# Patient Record
Sex: Female | Born: 1970 | Race: White | Hispanic: No | Marital: Single | Smoking: Current every day smoker
Health system: Southern US, Community
[De-identification: ages and names within clinical notes are randomized; demographics above are authoritative.]

## PROBLEM LIST (undated history)

## (undated) DIAGNOSIS — F32A Depression, unspecified: Secondary | ICD-10-CM

## (undated) DIAGNOSIS — F988 Other specified behavioral and emotional disorders with onset usually occurring in childhood and adolescence: Secondary | ICD-10-CM

## (undated) DIAGNOSIS — F419 Anxiety disorder, unspecified: Secondary | ICD-10-CM

## (undated) HISTORY — PX: ABDOMINAL HYSTERECTOMY: SHX81

## (undated) HISTORY — PX: COLONOSCOPY W/ POLYPECTOMY: SHX1380

## (undated) HISTORY — PX: HEMORROIDECTOMY: SUR656

## (undated) HISTORY — DX: Other specified behavioral and emotional disorders with onset usually occurring in childhood and adolescence: F98.8

## (undated) HISTORY — PX: CYST EXCISION: SHX5701

---

## 2006-09-28 ENCOUNTER — Emergency Department: Payer: Self-pay | Admitting: Emergency Medicine

## 2007-09-20 ENCOUNTER — Ambulatory Visit: Payer: Self-pay | Admitting: Internal Medicine

## 2008-04-01 ENCOUNTER — Emergency Department: Payer: Self-pay | Admitting: Emergency Medicine

## 2008-10-09 ENCOUNTER — Emergency Department: Payer: Self-pay | Admitting: Emergency Medicine

## 2009-01-31 ENCOUNTER — Observation Stay: Payer: Self-pay | Admitting: Unknown Physician Specialty

## 2009-05-07 ENCOUNTER — Inpatient Hospital Stay: Payer: Self-pay

## 2010-06-05 ENCOUNTER — Ambulatory Visit: Payer: Self-pay | Admitting: Internal Medicine

## 2013-06-08 HISTORY — PX: BREAST BIOPSY: SHX20

## 2014-03-24 ENCOUNTER — Emergency Department: Payer: Self-pay | Admitting: Emergency Medicine

## 2014-03-24 LAB — CBC WITH DIFFERENTIAL/PLATELET
BASOS PCT: 0.7 %
Basophil #: 0.1 10*3/uL (ref 0.0–0.1)
EOS PCT: 1 %
Eosinophil #: 0.1 10*3/uL (ref 0.0–0.7)
HCT: 41.5 % (ref 35.0–47.0)
HGB: 13.7 g/dL (ref 12.0–16.0)
LYMPHS PCT: 29.7 %
Lymphocyte #: 3.3 10*3/uL (ref 1.0–3.6)
MCH: 32.5 pg (ref 26.0–34.0)
MCHC: 33 g/dL (ref 32.0–36.0)
MCV: 99 fL (ref 80–100)
MONOS PCT: 9.3 %
Monocyte #: 1 x10 3/mm — ABNORMAL HIGH (ref 0.2–0.9)
NEUTROS PCT: 59.3 %
Neutrophil #: 6.6 10*3/uL — ABNORMAL HIGH (ref 1.4–6.5)
Platelet: 307 10*3/uL (ref 150–440)
RBC: 4.21 10*6/uL (ref 3.80–5.20)
RDW: 12.5 % (ref 11.5–14.5)
WBC: 11.1 10*3/uL — ABNORMAL HIGH (ref 3.6–11.0)

## 2014-03-24 LAB — COMPREHENSIVE METABOLIC PANEL
ALBUMIN: 3.8 g/dL (ref 3.4–5.0)
Alkaline Phosphatase: 78 U/L
Anion Gap: 8 (ref 7–16)
BUN: 8 mg/dL (ref 7–18)
Bilirubin,Total: 0.4 mg/dL (ref 0.2–1.0)
CALCIUM: 9 mg/dL (ref 8.5–10.1)
CO2: 27 mmol/L (ref 21–32)
Chloride: 107 mmol/L (ref 98–107)
Creatinine: 0.83 mg/dL (ref 0.60–1.30)
Glucose: 87 mg/dL (ref 65–99)
Osmolality: 281 (ref 275–301)
POTASSIUM: 3.3 mmol/L — AB (ref 3.5–5.1)
SGOT(AST): 20 U/L (ref 15–37)
SGPT (ALT): 21 U/L
SODIUM: 142 mmol/L (ref 136–145)
Total Protein: 7.1 g/dL (ref 6.4–8.2)

## 2014-03-24 LAB — URINALYSIS, COMPLETE
Bacteria: NONE SEEN
Bilirubin,UR: NEGATIVE
Blood: NEGATIVE
GLUCOSE, UR: NEGATIVE mg/dL (ref 0–75)
KETONE: NEGATIVE
LEUKOCYTE ESTERASE: NEGATIVE
NITRITE: NEGATIVE
PROTEIN: NEGATIVE
Ph: 7 (ref 4.5–8.0)
RBC, UR: NONE SEEN /HPF (ref 0–5)
SPECIFIC GRAVITY: 1.004 (ref 1.003–1.030)
Squamous Epithelial: 3
WBC UR: <1 /HPF (ref 0–5)

## 2014-04-16 ENCOUNTER — Ambulatory Visit: Payer: Self-pay | Admitting: Obstetrics and Gynecology

## 2014-04-16 LAB — BASIC METABOLIC PANEL
Anion Gap: 6 — ABNORMAL LOW (ref 7–16)
BUN: 13 mg/dL (ref 7–18)
CHLORIDE: 105 mmol/L (ref 98–107)
CO2: 28 mmol/L (ref 21–32)
Calcium, Total: 8.1 mg/dL — ABNORMAL LOW (ref 8.5–10.1)
Creatinine: 0.88 mg/dL (ref 0.60–1.30)
Glucose: 104 mg/dL — ABNORMAL HIGH (ref 65–99)
Osmolality: 278 (ref 275–301)
Potassium: 3.5 mmol/L (ref 3.5–5.1)
SODIUM: 139 mmol/L (ref 136–145)

## 2014-04-16 LAB — CBC
HCT: 38.2 % (ref 35.0–47.0)
HGB: 13 g/dL (ref 12.0–16.0)
MCH: 33.5 pg (ref 26.0–34.0)
MCHC: 34 g/dL (ref 32.0–36.0)
MCV: 99 fL (ref 80–100)
Platelet: 171 10*3/uL (ref 150–440)
RBC: 3.87 10*6/uL (ref 3.80–5.20)
RDW: 12.3 % (ref 11.5–14.5)
WBC: 7.1 10*3/uL (ref 3.6–11.0)

## 2014-04-17 ENCOUNTER — Ambulatory Visit: Payer: Self-pay | Admitting: Obstetrics and Gynecology

## 2014-04-17 LAB — DRUG SCREEN, URINE
Amphetamines, Ur Screen: POSITIVE (ref ?–1000)
BARBITURATES, UR SCREEN: NEGATIVE (ref ?–200)
Benzodiazepine, Ur Scrn: NEGATIVE (ref ?–200)
Cannabinoid 50 Ng, Ur ~~LOC~~: NEGATIVE (ref ?–50)
Cocaine Metabolite,Ur ~~LOC~~: NEGATIVE (ref ?–300)
MDMA (Ecstasy)Ur Screen: POSITIVE (ref ?–500)
Methadone, Ur Screen: NEGATIVE (ref ?–300)
Opiate, Ur Screen: POSITIVE (ref ?–300)
PHENCYCLIDINE (PCP) UR S: NEGATIVE (ref ?–25)
TRICYCLIC, UR SCREEN: NEGATIVE (ref ?–1000)

## 2014-04-30 ENCOUNTER — Ambulatory Visit: Payer: Self-pay | Admitting: Obstetrics and Gynecology

## 2014-05-02 ENCOUNTER — Encounter: Payer: Self-pay | Admitting: *Deleted

## 2014-05-10 ENCOUNTER — Ambulatory Visit (INDEPENDENT_AMBULATORY_CARE_PROVIDER_SITE_OTHER): Payer: No Typology Code available for payment source | Admitting: General Surgery

## 2014-05-10 ENCOUNTER — Other Ambulatory Visit: Payer: No Typology Code available for payment source

## 2014-05-10 ENCOUNTER — Encounter: Payer: Self-pay | Admitting: General Surgery

## 2014-05-10 VITALS — BP 106/64 | HR 84 | Resp 16 | Ht 64.0 in | Wt 123.0 lb

## 2014-05-10 DIAGNOSIS — N63 Unspecified lump in breast: Secondary | ICD-10-CM

## 2014-05-10 DIAGNOSIS — N6310 Unspecified lump in the right breast, unspecified quadrant: Secondary | ICD-10-CM

## 2014-05-10 DIAGNOSIS — R928 Other abnormal and inconclusive findings on diagnostic imaging of breast: Secondary | ICD-10-CM

## 2014-05-10 NOTE — Patient Instructions (Addendum)
Continue self breast exams. Call office for any new breast issues or concerns.  After discussion with radiologist, it is determined that patient will need to return for an office ultrasound guided core biopsy. This is scheduled for tomorrow morning.

## 2014-05-10 NOTE — Progress Notes (Signed)
Patient ID: Lindsey Horn, female   DOB: 11/01/1970, 43 y.o.   MRN: [id number]  Chief Complaint  Patient presents with  . Breast Problem    HPI Lindsey Horn is a 43 y.o. female who presents for a breast evaluation. The most recent mammogram was done on 04/30/14 with right breast ultrasound. She states she can feel something in the right breast since they have pointed it out to her and that breast is a little heavy "tender" feeling.  She has lost weight (15 lb) over the past year.  Patient does not perform regular self breast checks and gets regular mammograms done.   Denies family history of breast or colon cancer. Denies any breast trauma.   Plans for a partial hysterectomy on May 25 2014.  Three children, 22, 2618 and a 915 yr old.  HPI  No past medical history on file.  Past Surgical History  Procedure Laterality Date  . Cyst excision  age 43    tongue  . Cesarean section      x3  . Colonoscopy w/ polypectomy    . Hemorroidectomy      No family history on file.  Social History History  Substance Use Topics  . Smoking status: Current Every Day Smoker -- 1.00 packs/day for 20 years    Types: Cigarettes  . Smokeless tobacco: Never Used  . Alcohol Use: 0.0 oz/week    0 Not specified per week    No Known Allergies  Current Outpatient Prescriptions  Medication Sig Dispense Refill  . buPROPion (WELLBUTRIN XL) 150 MG 24 hr tablet Take 150 mg by mouth daily.  11  . gabapentin (NEURONTIN) 600 MG tablet Take 600 mg by mouth 3 (three) times daily.     . meloxicam (MOBIC) 15 MG tablet Take 15 mg by mouth daily.   11  . traZODone (DESYREL) 100 MG tablet Take 100 mg by mouth as needed.     Marland Kitchen. VYVANSE 40 MG capsule Take 40 mg by mouth every morning.  0   No current facility-administered medications for this visit.    Review of Systems Review of Systems  Blood pressure 106/64, pulse 84, resp. rate 16, height 5\' 4"  (1.626 m), weight 123 lb (55.792 kg), last  menstrual period 04/23/2014.  Physical Exam Physical Exam  Constitutional: She is oriented to person, place, and time. She appears well-developed and well-nourished.  Neck: Neck supple.  Cardiovascular: Normal rate, regular rhythm and normal heart sounds.   Pulmonary/Chest: Effort normal and breath sounds normal. Right breast exhibits no inverted nipple, no mass, no nipple discharge, no skin change and no tenderness. Left breast exhibits no inverted nipple, no mass, no nipple discharge, no skin change and no tenderness.    Right breast > left breast about 1/2 cup. Small right chest bruise.  Lymphadenopathy:    She has no cervical adenopathy.    She has no axillary adenopathy.  Neurological: She is alert and oriented to person, place, and time.  Skin: Skin is warm and dry.    Data Reviewed Diagnostic mammograms dated 08/29/2012 for palpable lesions within the breast completed [facility] were reviewed. Mammograms and ultrasounds were negative. BI-RADS-1.  Screening mammograms were completed at [facility] in November 2015. The report from the providing facility was not available, but additional views were recommended of the right breast.  Right breast diagnostic mammogram and ultrasound dated 04/30/2014 [facility] reported a developing oval mass with indistinct margins in the lower inner right breast  posterior depth without associated calcifications. Ultrasound reported a 5 x 18 x 19 mm oval isoechoic mass with circumscribed margins at 5:00, 1 cm from the nipple. Ultrasound-guided biopsy was recommended. BI-RADS-4.  There appeared to be a significant discordance between the location of the mass on the mammogram, at the inframammary fold and the ultrasound reporting a mass in the near retroareolar area.   I recommended a repeat ultrasound be completed to reassess the area near the inframammary fold. In the right breast at the 5:00 position, 1 cm from the nipple a 0.7 x 1.6 x  1.8 cm smoothly marginated hypoechoic mass with sharp edge effect and faint posterior acoustic enhancement corresponding to [facility] study is identified. In spite of its superficial location this was not clinically palpable. No associated mammographic abnormalities appreciated in the retroareolar area.  At the 5:00 position 6 cm from the nipple there is an ill-defined heterogeneous area measuring 0.5 x 1.3 x 1.4 cm just above the level of the inframammary fold that seems to better correspond with the mammographic finding. BI-RADS-4.   [facility] films were reviewed with Annia Beltrew Davis, M.D. from the radiology department as Dr. Clelia CroftShaw would not be returning until next week. He felt that these were likely one in the same lesion as no other abnormality was noted on mammography and the possibility of an alternative projection based on her breast size could account for the discordance.    Assessment     Right breast lesion since March 2014.    Plan    The smoothly marginated lesion noted on ultrasound seems distinctly different than the irregular mass appreciated on mammography. I recommended the patient consider biopsy of both lesions followed by postbiopsy mammography. The risks associated with biopsy were reviewed including bleeding, pain and rarely infection. This will be scheduled at a convenient date.      Discuss mammograms with radiologist.  Discussed having a right breast biopsy prior to her planned surgery.  After discussion with radiologist, it is determined that patient will need to return for an office ultrasound guided core biopsy. This is scheduled for tomorrow morning.   PCP:  Titus MouldWhite, Elizabeth Burney Ref MD: Dr. Constance HolsterStaebler   Gavino Fouch, Merrily PewJeffrey W 05/10/2014, 8:58 PM

## 2014-05-11 ENCOUNTER — Ambulatory Visit: Payer: No Typology Code available for payment source | Admitting: General Surgery

## 2014-05-11 ENCOUNTER — Encounter: Payer: Self-pay | Admitting: General Surgery

## 2014-05-11 ENCOUNTER — Ambulatory Visit: Payer: Self-pay | Admitting: General Surgery

## 2014-05-11 ENCOUNTER — Ambulatory Visit: Payer: No Typology Code available for payment source

## 2014-05-11 VITALS — BP 122/70 | HR 74 | Resp 12 | Ht 64.0 in | Wt 123.0 lb

## 2014-05-11 DIAGNOSIS — N6310 Unspecified lump in the right breast, unspecified quadrant: Secondary | ICD-10-CM

## 2014-05-11 DIAGNOSIS — N63 Unspecified lump in breast: Secondary | ICD-10-CM

## 2014-05-11 DIAGNOSIS — R928 Other abnormal and inconclusive findings on diagnostic imaging of breast: Secondary | ICD-10-CM

## 2014-05-11 NOTE — Patient Instructions (Signed)

## 2014-05-11 NOTE — Progress Notes (Signed)
Patient ID: Lindsey Horn, female   DOB: [birth year], 43 y.o.   MRN: [id number]  Chief Complaint  Patient presents with  . Procedure    core biopsy    HPI Lindsey Horn is a 43 y.o. female here today for a right breast biopsy. The patient recent mammogram showed a new density at the periphery of the breast in the lower inner quadrant. Ultrasound completed at the hospital at suggested a retroareolar nodule likely a fibroadenoma. There appeared to be a discordance on my review the films and biopsy of both lesions was planned. HPI  No past medical history on file.  Past Surgical History  Procedure Laterality Date  . Cyst excision  age 43    tongue  . Cesarean section      x3  . Colonoscopy w/ polypectomy    . Hemorroidectomy      No family history on file.  Social History History  Substance Use Topics  . Smoking status: Current Every Day Smoker -- 1.00 packs/day for 20 years    Types: Cigarettes  . Smokeless tobacco: Never Used  . Alcohol Use: 0.0 oz/week    0 Not specified per week    No Known Allergies  Current Outpatient Prescriptions  Medication Sig Dispense Refill  . buPROPion (WELLBUTRIN XL) 150 MG 24 hr tablet Take 150 mg by mouth daily.  11  . gabapentin (NEURONTIN) 600 MG tablet Take 600 mg by mouth 3 (three) times daily.     . meloxicam (MOBIC) 15 MG tablet Take 15 mg by mouth daily.   11  . traZODone (DESYREL) 100 MG tablet Take 100 mg by mouth as needed.     Marland Kitchen. VYVANSE 40 MG capsule Take 40 mg by mouth every morning.  0   No current facility-administered medications for this visit.    Review of Systems Review of Systems  Constitutional: Negative.   Respiratory: Negative.   Cardiovascular: Negative.     Blood pressure 122/70, pulse 74, resp. rate 12, height 5\' 4"  (1.626 m), weight 123 lb (55.792 kg), last menstrual period 04/23/2014.  Physical Exam Physical Exam No palpable abnormalities noted within the breast. Data Reviewed A total of 16 mL of  0.5% Xylocaine with 0.25% Marcaine with 1-200,000 epinephrine was utilized for local anesthesia both lesions. ChloraPrep was applied to the skin. The lesion in the retroareolar tissue at the 5:00 position, 1 centimeter from the nipple was biopsied making use of a 14-gauge Finesse device. Approximately 8 core samples were obtained and a postbiopsy clip was placed. Minimal bleeding noted.  Attention was turned to the area the 4:00 position at the inframammary fold. Approximately 8 core samples were obtained and a postbiopsy clip placed. There is mild discomfort during the last 2 samples. This resolved promptly by the end of the procedure.  Both wounds were closed making use of benzoin and Steri-Strips followed by Telfa and Tegaderm dressings.  [facility]. Clips are noted in the retroareolar area and just lateral to the density in the lower inner quadrant.  Assessment    Abnormal mammogram.    Plan    Postbiopsy wound care was reviewed.  The patient will return in one week for wound evaluation with the staff.  She'll be contacted when pathology is available.       Earline MayotteByrnett, Mauro Arps W 05/11/2014, 7:44 PM

## 2014-05-14 ENCOUNTER — Telehealth: Payer: Self-pay | Admitting: *Deleted

## 2014-05-14 ENCOUNTER — Telehealth: Payer: Self-pay | Admitting: General Surgery

## 2014-05-14 NOTE — Telephone Encounter (Signed)
Patient notified that both biopsies done on 05-11-14 came back benign. This patient verbalizes understanding.

## 2014-05-15 LAB — PATHOLOGY

## 2014-05-15 NOTE — Telephone Encounter (Signed)
x

## 2014-05-17 ENCOUNTER — Ambulatory Visit: Payer: Self-pay | Admitting: Obstetrics and Gynecology

## 2014-05-17 LAB — BASIC METABOLIC PANEL
Anion Gap: 7 (ref 7–16)
BUN: 14 mg/dL (ref 7–18)
CO2: 25 mmol/L (ref 21–32)
Calcium, Total: 8.8 mg/dL (ref 8.5–10.1)
Chloride: 108 mmol/L — ABNORMAL HIGH (ref 98–107)
Creatinine: 0.68 mg/dL (ref 0.60–1.30)
EGFR (African American): >60
EGFR (Non-African Amer.): >60
GLUCOSE: 126 mg/dL — AB (ref 65–99)
Osmolality: 281 (ref 275–301)
POTASSIUM: 3.3 mmol/L — AB (ref 3.5–5.1)
Sodium: 140 mmol/L (ref 136–145)

## 2014-05-17 LAB — CBC
HCT: 45 % (ref 35.0–47.0)
HGB: 15.2 g/dL (ref 12.0–16.0)
MCH: 32.8 pg (ref 26.0–34.0)
MCHC: 33.7 g/dL (ref 32.0–36.0)
MCV: 97 fL (ref 80–100)
Platelet: 226 10*3/uL (ref 150–440)
RBC: 4.63 10*6/uL (ref 3.80–5.20)
RDW: 12.5 % (ref 11.5–14.5)
WBC: 7.3 10*3/uL (ref 3.6–11.0)

## 2014-05-18 ENCOUNTER — Ambulatory Visit: Payer: No Typology Code available for payment source

## 2014-05-22 ENCOUNTER — Ambulatory Visit: Payer: No Typology Code available for payment source

## 2014-05-25 ENCOUNTER — Ambulatory Visit: Payer: Self-pay | Admitting: Obstetrics and Gynecology

## 2014-05-25 LAB — POTASSIUM: POTASSIUM: 3.9 mmol/L (ref 3.5–5.1)

## 2014-05-25 LAB — DRUG SCREEN, URINE
Amphetamines, Ur Screen: POSITIVE (ref ?–1000)
Barbiturates, Ur Screen: NEGATIVE (ref ?–200)
Benzodiazepine, Ur Scrn: NEGATIVE (ref ?–200)
CANNABINOID 50 NG, UR ~~LOC~~: NEGATIVE (ref ?–50)
COCAINE METABOLITE, UR ~~LOC~~: NEGATIVE (ref ?–300)
MDMA (Ecstasy)Ur Screen: POSITIVE (ref ?–500)
METHADONE, UR SCREEN: NEGATIVE (ref ?–300)
Opiate, Ur Screen: NEGATIVE (ref ?–300)
Phencyclidine (PCP) Ur S: NEGATIVE (ref ?–25)
TRICYCLIC, UR SCREEN: NEGATIVE (ref ?–1000)

## 2014-05-26 LAB — CBC WITH DIFFERENTIAL/PLATELET
Basophil #: 0.1 10*3/uL (ref 0.0–0.1)
Basophil %: 0.4 %
Eosinophil #: 0 10*3/uL (ref 0.0–0.7)
Eosinophil %: 0 %
HCT: 38.3 % (ref 35.0–47.0)
HGB: 12.5 g/dL (ref 12.0–16.0)
LYMPHS ABS: 2.2 10*3/uL (ref 1.0–3.6)
LYMPHS PCT: 12.4 %
MCH: 32.2 pg (ref 26.0–34.0)
MCHC: 32.8 g/dL (ref 32.0–36.0)
MCV: 98 fL (ref 80–100)
MONO ABS: 0.8 x10 3/mm (ref 0.2–0.9)
Monocyte %: 4.4 %
NEUTROS ABS: 14.9 10*3/uL — AB (ref 1.4–6.5)
NEUTROS PCT: 82.8 %
Platelet: 250 10*3/uL (ref 150–440)
RBC: 3.89 10*6/uL (ref 3.80–5.20)
RDW: 12.7 % (ref 11.5–14.5)
WBC: 18 10*3/uL — AB (ref 3.6–11.0)

## 2014-05-26 LAB — BASIC METABOLIC PANEL
Anion Gap: 7 (ref 7–16)
BUN: 8 mg/dL (ref 7–18)
CHLORIDE: 106 mmol/L (ref 98–107)
CREATININE: 0.9 mg/dL (ref 0.60–1.30)
Calcium, Total: 8.5 mg/dL (ref 8.5–10.1)
Co2: 26 mmol/L (ref 21–32)
Glucose: 137 mg/dL — ABNORMAL HIGH (ref 65–99)
OSMOLALITY: 278 (ref 275–301)
Potassium: 3.8 mmol/L (ref 3.5–5.1)
Sodium: 139 mmol/L (ref 136–145)

## 2014-07-21 ENCOUNTER — Emergency Department: Payer: Self-pay | Admitting: Emergency Medicine

## 2014-08-02 ENCOUNTER — Emergency Department: Payer: Self-pay | Admitting: Emergency Medicine

## 2014-09-21 ENCOUNTER — Other Ambulatory Visit: Payer: Self-pay | Admitting: General Surgery

## 2014-09-21 DIAGNOSIS — N63 Unspecified lump in unspecified breast: Secondary | ICD-10-CM

## 2014-09-29 NOTE — Op Note (Signed)
PATIENT NAME:  Lindsey Horn, Lindsey Horn MR#:  [id number] DATE OF BIRTH:  Aug 06, 1970  DATE OF PROCEDURE:  04/17/2014  PREOPERATIVE DIAGNOSES:  1.  Chronic pelvic pain. 2.  Menorrhagia. 3.  Severe dysmenorrhea.   POSTOPERATIVE DIAGNOSES:  1.  Chronic pelvic pain. 2.  Menorrhagia. 3.  Severe dysmenorrhea.  4.  Significant intra-abdominal adhesions from prior C-section.   OPERATION PERFORMED: Hysteroscopy, dilatation and curettage, cystoscopy and diagnostic laparoscopy.   ANESTHESIA USED: General.   PRIMARY SURGEON: Lorrene ReidAndreas M Argyle Gustafson, MD    ESTIMATED BLOOD LOSS: Minimal.   OPERATIVE FLUIDS: 50 mL of urine output.   COMPLICATIONS: None.   INTRAOPERATIVE FINDINGS: Significant scarring at the prior bladder flap and anterior uterine wall with partial fusion to the anterior abdominal wall.  Some adhesions of omentum which were taken down with the 5 mm Harmonic cystoscopy and hysteroscopy revealed normal findings.   SPECIMENS REMOVED: Endometrial curettings.   CONDITION FOLLOWING PROCEDURE: Stable.   PROCEDURE IN DETAIL: Risks, benefits, and alternatives of the procedure were discussed with the patient prior to proceeding to the Operating Room. The patient was taken to the Operating Room where she was placed under general endotracheal anesthesia. She was positioned in the dorsal lithotomy position, prepped and draped in the usual sterile fashion. Timeout was performed. Attention was turned to the patient's pelvis. The bladder was straight catheterized with a red rubber catheter. An operative speculum was placed. The anterior lip of the cervix was visualized, grasped with a single-tooth tenaculum and sequentially dilated using Pratt dilators.  A Hulka tenaculum was then placed to allow manipulation of the uterus, the single-tooth tenaculum and speculum were then removed.  Attention was turned to the patient's abdomen. The umbilicus was infiltrated with 0.5% Sensorcaine plain. A stab incision was made  at the base of the umbilicus and a 5 mm XL trocar was used to gain direct entry into the peritoneal cavity under direct visualization.  Pneumoperitoneum was established. A 5 mm left lower quadrant assistant port was then placed under direct visualization. Inspection of the abdomen revealed the normal liver, normal appendix, normal caliber of both the ureters. The posterior cul-de-sac was free of endometriotic implants or scarring.  The ovaries appeared normal. There was a simple cyst on the right ovary which was ruptured.  Inspection of the anterior cul-de-sac revealed dense adhesive tissue involving the bladder and anterior uterus to the anterior abdominal wall at the site of the patient's prior hysterotomy scar.  There were 2 small filmy adhesions of omentum which were taken down with a 5 mm Harmonic; 1 on the left and 1 on the right mid abdomen.  Following this, the pneumoperitoneum was evacuated.  Attention was turned to the patient's pelvis again and cystoscopy was performed noting normal bladder dome, bilateral efflux of urine from both UO's, and otherwise, a normal appearing bladder epithelium.  Following the cystoscopy portion of the case, the operative speculum was replaced, the cervix was regrasped with a single-tooth tenaculum after releasing the Hulka tenaculum.  The cervix was further dilated to allow passage of the hysteroscope.  Hysteroscopy revealed normal fallopian tube ostia bilaterally. The cavity was grossly normal in shape with no defects visualized.  A sharp curettage was performed yielding a moderate amount of tissue. The single-tooth tenaculum was removed. The cervix was inspected and noted to be hemostatic. Pneumoperitoneum was re-established and the abdomen was reinspected, irrigated before removing pneumoperitoneum again and removing the trocars.  Each trocar site was dressed with Dermabond.  Sponge, needle, and  instruments were correct x 2.  The patient tolerated the procedure well and  was taken to the recovery room in stable condition. \    ____________________________ Florina Ou. Bonney Aid, MD ams:DT D: 04/17/2014 09:34:56 ET T: 04/17/2014 09:47:45 ET JOB#: [id number]  cc: Florina Ou. Bonney Aid, MD, <Dictator> Carmel [place] Cathrine Muster MD ELECTRONICALLY SIGNED 04/26/2014 8:51

## 2014-10-01 LAB — SURGICAL PATHOLOGY

## 2014-10-03 NOTE — Op Note (Signed)
PATIENT NAME:  Lindsey Horn, STREB MR#:  [id number] DATE OF BIRTH:  08-17-1970  DATE OF PROCEDURE:  05/25/2014  PREOPERATIVE DIAGNOSES: Chronic pelvic pain and uterine adhesions identified on prior diagnostic laparoscopy.   POSTOPERATIVE DIAGNOSES: Chronic pelvic pain and uterine adhesions identified on prior diagnostic laparoscopy.    OPERATION PERFORMED: Robotic total laparoscopic hysterectomy, bilateral salpingectomy, extensive lysis of adhesions and cystoscopy.   ANESTHESIA USED: General.   PRIMARY SURGEON: Florina Ou. Bonney Aid, MD    ASSISTANT: Chelsea C. Ward, MD   PREOPERATIVE ANTIBIOTICS: 2 grams of Ancef.   DRAINS OR TUBES: Foley to gravity drainage.   IMPLANTS: None.   ESTIMATED BLOOD LOSS: 100 mL.   OPERATIVE FLUIDS: 1500 mL of crystalloid.   COMPLICATIONS: None.   INTRAOPERATIVE FINDINGS: Dense adhesive disease of the anterior left uterine fundus to the anterior abdominal wall including bladder at the site of a prior hysterotomy, intact bladder dome with bilateral efflux of urine from both ureteral orifices.   SPECIMENS REMOVED: Uterus, cervix, right and left tube.   PATIENT CONDITION FOLLOWING PROCEDURE: Stable.   PROCEDURE IN DETAIL: Risks, benefits, and alternatives of the procedure were discussed with the patient prior to proceeding to the Operating Room. In addition, prior to proceeding to the Operating Room, we had discussed the possibility of the patient having continued pain following the procedure although given the findings on diagnostic laparoscopy matched in laterality to the findings, it was reasonable to assume that her pain was coming from the scar tissue from her prior C-sections. The patient was taken to the Operating Room where she was placed under general endotracheal anesthesia. She was positioned in the dorsal lithotomy position and prepped and draped in the usual sterile fashion. A timeout was performed. The patient was tested in steep Trendelenburg with  proper positioning confirmed. The patient was then prepped and draped in the usual sterile fashion. A timeout was performed.   Attention was turned to the patient's pelvis. The bladder was drained using an in-dwelling Foley catheter. The operative speculum was used. The anterior lip of the cervix was grasped with a single-tooth tenaculum and a medium VCare device was placed to allow manipulation of the uterus. The single-tooth tenaculum and operative speculum were removed. Attention was turned to the patient's abdomen. The umbilicus was infiltrated with 0.5% Sensorcaine. A stab incision was made at the base of the umbilicus and a Veress needle was used to obtain pneumoperitoneum. Two clicks were noted with the Veress needle. Water drop test confirmed intraperitoneal entry and opening pressure was 2 mmHg.    Once pneumoperitoneum had been established, an 8 mm robotic camera trocar was placed through the umbilical incision. The remaining ports were placed under direct visualization. These included 2 lateral 8 mm robotic ports, 1 in the left lower quadrant and 1 in the right lower quadrant and a right upper quadrant 11 mm assistant port. The robot was then brought in and docked. The case was begun.   Attention was turned to the patient's left adnexa. The fallopian tube was dissected off its attachments to the ovary and mesosalpinx using the fenestrated bipolar and the monopolar scissors. The utero-ovarian and round ligaments were then cauterized and transected. The anterior leaf of the broad was dissected down to the adhesions on the left portion of the anterior uterus. The adhesions themselves were then started to be taken down using the monopolar scissors. The thicker band of adhesion was left intact.   Attention was then turned to the patient's right. The  right adnexal structures were identified. The fallopian tube was transected off the uterus. The utero-ovarian ligament and round ligaments were then  cauterized and transected. The anterior leaf of the broad was taken down to the level of the internal cervical os. The posterior leaf was skeletonized and the uterine artery was visualized, cauterized, and transected using the Harmonic and the monopolar scissors. The bladder was then carefully dissected off the lower uterine segment. The bladder was at one point retrograde filled with methylene blue to allow visualization of the bladder and verify that it was being taken off in the correct plane. Following this, a colpotomy was made circumferentially around the specimen starting anteriorly and moving clockwise. By doing this, the left adhesion was able to be further visualized. The left uterine artery was skeletonized, cauterized and transected. The adhesion was then taken down by staying medial to the transection point of the uterine arteries to take the adhesion down. The specimen was then removed vaginally. The vaginal cuff was closed using a V-Loc suture device. Following the closure of the vaginal cuff, the pelvis was irrigated and noted to be hemostatic. One gram of Arista powder was applied to all pedicles. Pneumoperitoneum was evacuated. A cystoscopy from below revealed intact bladder dome and bilateral efflux of urine from both UO's. The ureters had been visualized from the abdominal side as well prior to removing the port sites.   The port sites were closed with a 4-0 Monocryl and Dermabond. The 11 mm port site was also closed with a fascial stitch of 0 Vicryl. Sponge, needle and instrument counts were correct x2. The patient tolerated the procedure well and was taken to the recovery room in stable condition.   ____________________________ Florina OuAndreas M. Bonney AidStaebler, MD ams:AT D: 05/25/2014 16:51:03 ET T: 05/25/2014 23:12:37 ET JOB#: [id number]  cc: Florina OuAndreas M. Bonney AidStaebler, MD, <Dictator> Carmel SacramentoANDREAS Cathrine MusterM Haydon Kalmar MD ELECTRONICALLY SIGNED 06/19/2014 15:36

## 2014-10-22 ENCOUNTER — Other Ambulatory Visit: Payer: Self-pay

## 2014-10-23 ENCOUNTER — Telehealth: Payer: Self-pay | Admitting: *Deleted

## 2014-10-23 NOTE — Telephone Encounter (Signed)
Left message for patient to call the office back to r/s her appointment

## 2014-10-29 ENCOUNTER — Ambulatory Visit: Payer: No Typology Code available for payment source | Admitting: General Surgery

## 2014-11-02 ENCOUNTER — Ambulatory Visit
Admission: RE | Admit: 2014-11-02 | Discharge: 2014-11-02 | Disposition: A | Discharge status: Home / Self Care | Payer: No Typology Code available for payment source | Source: Ambulatory Visit | Attending: General Surgery | Admitting: General Surgery

## 2014-11-02 DIAGNOSIS — N63 Unspecified lump in unspecified breast: Secondary | ICD-10-CM

## 2014-11-02 DIAGNOSIS — N6489 Other specified disorders of breast: Secondary | ICD-10-CM | POA: Insufficient documentation

## 2014-11-08 ENCOUNTER — Encounter: Payer: Self-pay | Admitting: General Surgery

## 2014-11-08 ENCOUNTER — Ambulatory Visit (INDEPENDENT_AMBULATORY_CARE_PROVIDER_SITE_OTHER): Payer: No Typology Code available for payment source | Admitting: General Surgery

## 2014-11-08 VITALS — BP 122/82 | HR 76 | Resp 12 | Ht 64.0 in | Wt 114.0 lb

## 2014-11-08 DIAGNOSIS — N63 Unspecified lump in breast: Secondary | ICD-10-CM | POA: Diagnosis not present

## 2014-11-08 DIAGNOSIS — N6310 Unspecified lump in the right breast, unspecified quadrant: Secondary | ICD-10-CM

## 2014-11-08 DIAGNOSIS — R928 Other abnormal and inconclusive findings on diagnostic imaging of breast: Secondary | ICD-10-CM

## 2014-11-08 NOTE — Patient Instructions (Addendum)
Continue with self breast exams, call with any changes. Return in 6 months with bilateral diagnostic mammogram.

## 2014-11-08 NOTE — Progress Notes (Signed)
Patient ID: Lindsey Horn, female   DOB: September 06, 1970, 44 y.o.   MRN: [id number]  Chief Complaint  Patient presents with  . Follow-up    mammogram    HPI Lindsey Horn is a 44 y.o. female who presents for a breast evaluation. The most recent mammogram was done on 11/02/14 Patient does perform regular self breast checks and gets regular mammograms done.  Patient states right breast tender for about 6 months or more.    HPI  No past medical history on file.  Past Surgical History  Procedure Laterality Date  . Cyst excision  age 44    tongue  . Cesarean section      x3  . Colonoscopy w/ polypectomy    . Hemorroidectomy    . Breast biopsy Right 2015    neg    Family History  Problem Relation Age of Onset  . Breast cancer Maternal Grandmother     Social History History  Substance Use Topics  . Smoking status: Current Every Day Smoker -- 1.00 packs/day for 20 years    Types: Cigarettes  . Smokeless tobacco: Never Used  . Alcohol Use: 0.0 oz/week    0 Standard drinks or equivalent per week    No Known Allergies  Current Outpatient Prescriptions  Medication Sig Dispense Refill  . buPROPion (WELLBUTRIN XL) 150 MG 24 hr tablet Take 150 mg by mouth daily.  11  . gabapentin (NEURONTIN) 600 MG tablet Take 600 mg by mouth 3 (three) times daily.     . meloxicam (MOBIC) 15 MG tablet Take 15 mg by mouth daily.   11  . traZODone (DESYREL) 100 MG tablet Take 100 mg by mouth as needed.     Marland Kitchen. VYVANSE 40 MG capsule Take 40 mg by mouth every morning.  0   No current facility-administered medications for this visit.    Review of Systems Review of Systems  Constitutional: Negative.   Respiratory: Negative.   Cardiovascular: Negative.     Blood pressure 122/82, pulse 76, resp. rate 12, height 5\' 4"  (1.626 m), weight 114 lb (51.71 kg).  Physical Exam Physical Exam  Constitutional: She is oriented to person, place, and time. She appears well-developed and well-nourished.   Cardiovascular: Normal rate and normal heart sounds.   Pulmonary/Chest: Effort normal and breath sounds normal. Right breast exhibits tenderness. Right breast exhibits no inverted nipple, no mass, no nipple discharge and no skin change. Left breast exhibits no inverted nipple, no mass, no nipple discharge, no skin change and no tenderness. Breasts are symmetrical.    Mild diffuse tenderness throughout the right breast, more notable in the lower inner quadrant. Granular texture to the breast bilaterally.  Lymphadenopathy:  6 mm smooth, nontender node at the apex of the right axilla. Left axilla negative.  Neurological: She is alert and oriented to person, place, and time.  Skin: Skin is warm and dry.    Data Reviewed Recent mammogram and ultrasound reviewed and compared to the December 2015 ultrasound of the right lower inner quadrant area biopsied in December. There appears to be good correlation of the area of biopsy and the present area of radiographic concern. Biopsy results below.    PATH REPORT.FINAL DX SPEC Comment   Comments:   Diagnosis:  BREAST, RIGHT:  - BENIGN BREAST EPITHELIUM AND ADJACENT FIBROADIPOSE TISSUE.  - NEGATIVE FOR ATYPIA AND MALIGNANCY.  NOTE: The results of this case were discussed with Dr. Lemar LivingsByrnett on  May 14, 2014 by Dr. Excell SeltzerBaker.  XDB/05/14/2014          The radiologist commented that the biopsy clip was posterior to the area of concern. This may be clip migration into the adipose tissue, as images obtained during the procedure confirm the area of concern was biopsied.  Assessment    Stable mammogram/ultrasound. Previous biopsy of area benign.    Plan    The radiologist recommendations were reviewed with the patient. At this time, I don't see indications to rebiopsy the area. The opportunity for formal excision to put all issues to rest was discussed but deferred at this time. I think it reasonable to go ahead and plan on a repeat bilateral  diagnostic mammograms in November 2016 with an office visit to follow.      Patient to have a bilateral diagnostic mammogram follow up in 6 months.  PCP: Trudie Buckler 11/08/2014, 2:05 PM

## 2015-02-28 ENCOUNTER — Other Ambulatory Visit: Payer: Self-pay

## 2015-02-28 DIAGNOSIS — N6310 Unspecified lump in the right breast, unspecified quadrant: Secondary | ICD-10-CM

## 2015-03-11 IMAGING — US US BREAST*R* LIMITED INC AXILLA
1 series · 6 of 6 positions shown · non-contrast
Comparison: 04/03/2014, 08/29/2012

CLINICAL DATA: 43-year-old female, callback from screening
mammogram for possible right breast mass

EXAM:
DIGITAL DIAGNOSTIC  RIGHT MAMMOGRAM
ULTRASOUND RIGHT BREAST

[Series 1: us breast*right* limited inc axilla · 0.08mm/px · 6 of 6 slices shown]
[im 1/6]
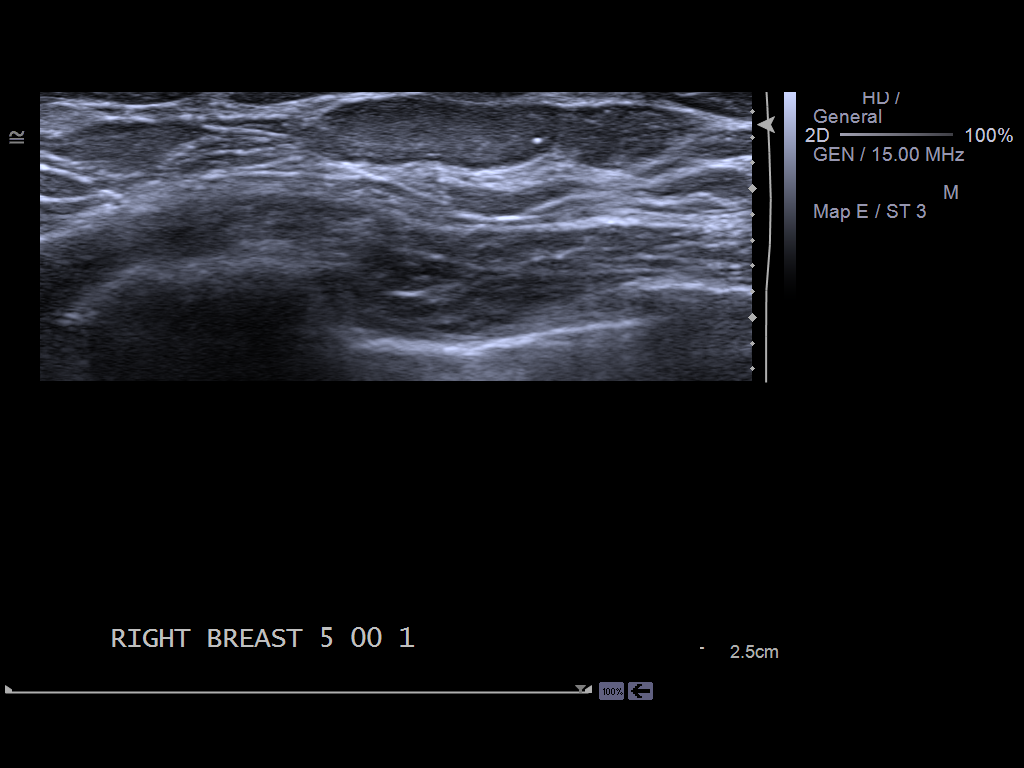
[im 2/6]
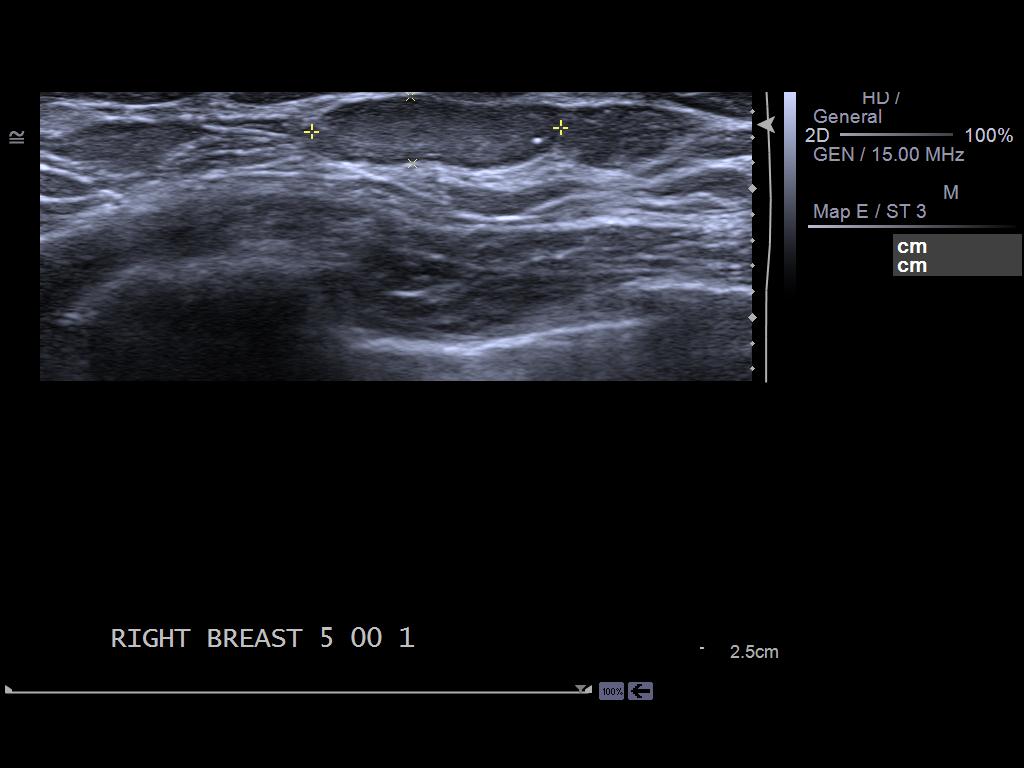
[im 3/6]
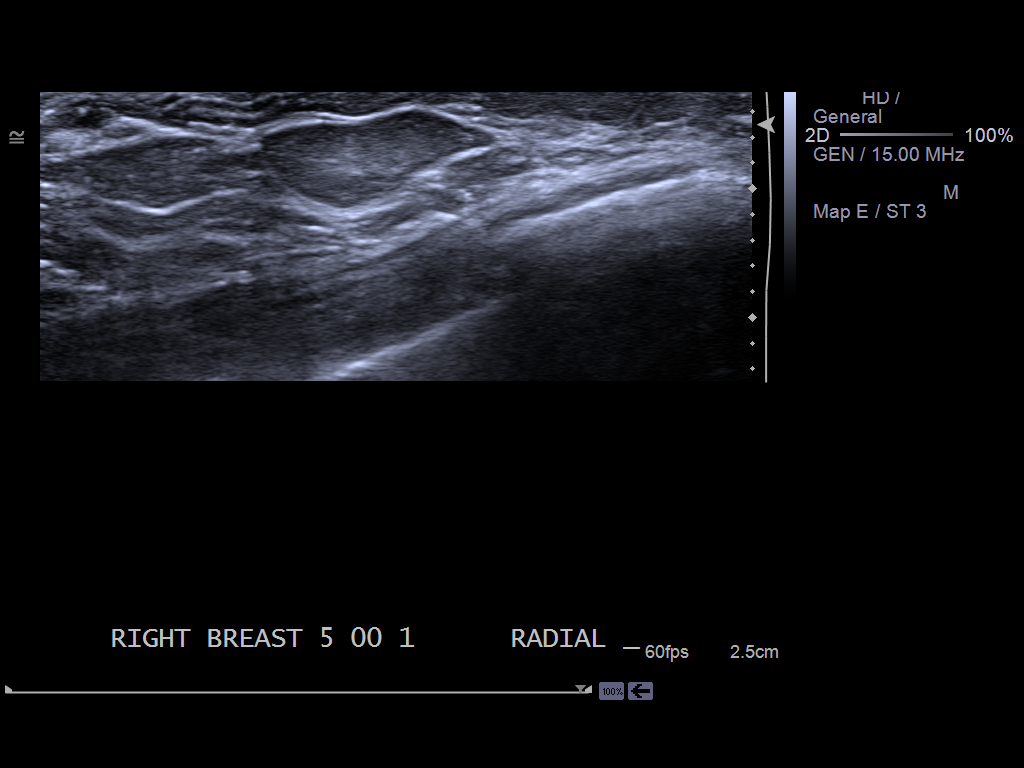
[im 4/6]
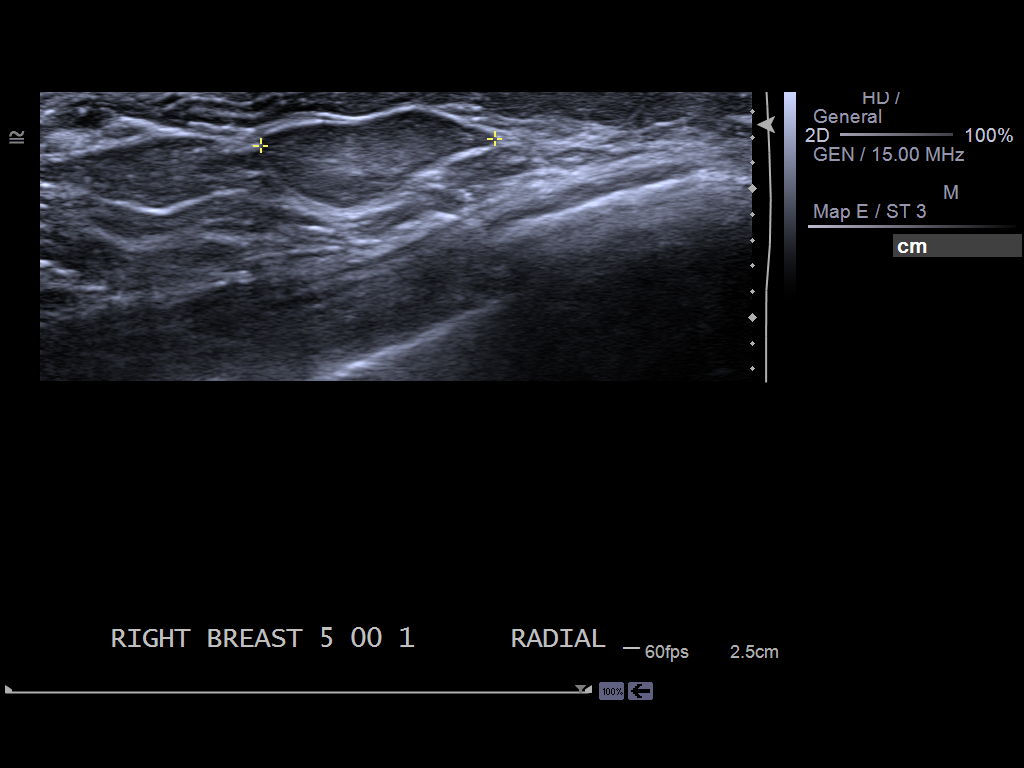
[im 5/6]
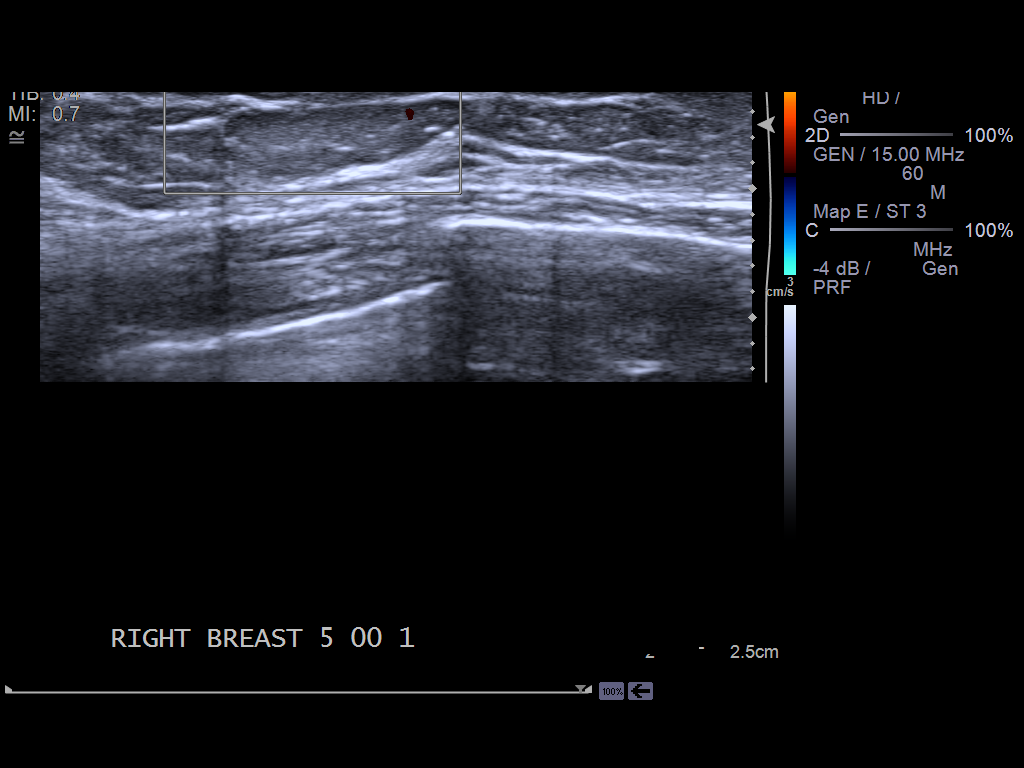
[im 6/6]
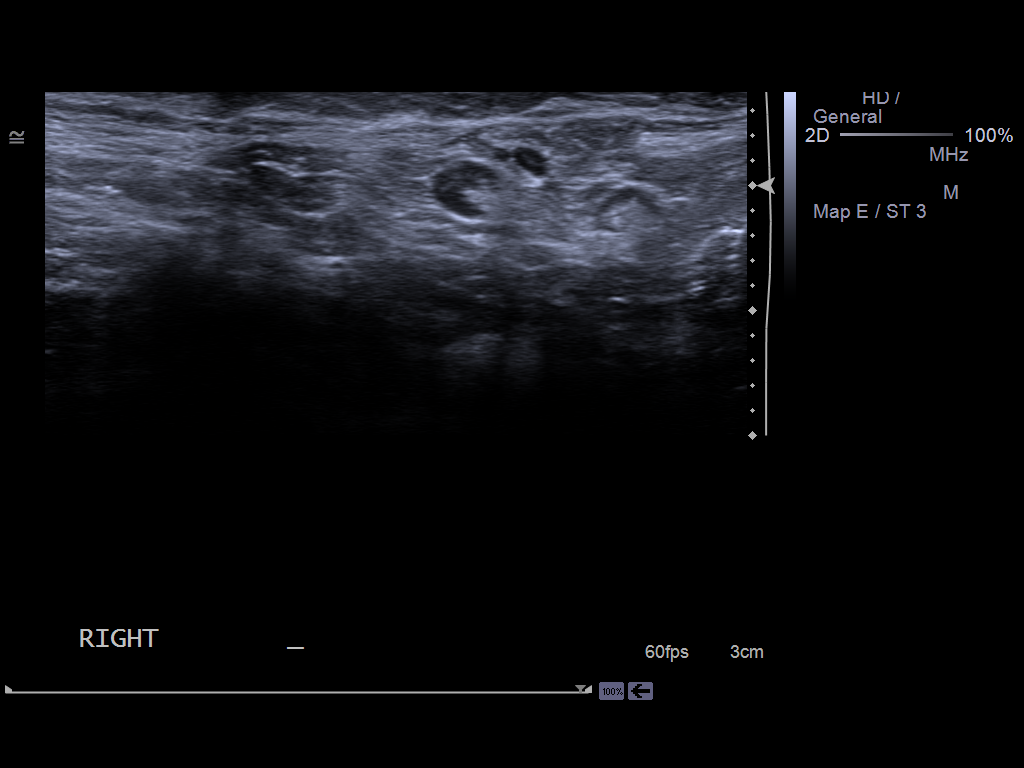

[6 of 6 positions shown; findings below may reference images not displayed]

ACR Breast Density Category c: The breast tissue is heterogeneously
dense, which may obscure small masses.
FINDINGS: On additional views, there is a developing oval mass with indistinct
margins within the lower, inner right breast, posterior depth. No
associated calcifications.

Targeted ultrasound demonstrates an 18 x 19 x 5 mm oval, isoechoic
mass with circumscribed margins at 5 o'clock, 1 cm from the nipple.
No axillary adenopathy.
IMPRESSION: Developing mass within the lower, inner right breast. While this
mass likely represents an enlarging fibroadenoma, ultrasound-guided
biopsy is recommended due to its increasing size.

RECOMMENDATION:
Ultrasound-guided right breast biopsy.

I have discussed the findings and recommendations with the patient.
Results were also provided in writing at the conclusion of the
visit. If applicable, a reminder letter will be sent to the patient
regarding the next appointment.

BI-RADS CATEGORY  4: Suspicious.

## 2015-04-10 ENCOUNTER — Other Ambulatory Visit: Payer: Self-pay | Admitting: General Surgery

## 2015-04-10 ENCOUNTER — Encounter (INDEPENDENT_AMBULATORY_CARE_PROVIDER_SITE_OTHER): Payer: Self-pay

## 2015-04-10 ENCOUNTER — Ambulatory Visit
Admission: RE | Admit: 2015-04-10 | Discharge: 2015-04-10 | Disposition: A | Discharge status: Home / Self Care | Payer: No Typology Code available for payment source | Source: Ambulatory Visit | Attending: General Surgery | Admitting: General Surgery

## 2015-04-10 DIAGNOSIS — N63 Unspecified lump in breast: Secondary | ICD-10-CM | POA: Diagnosis present

## 2015-04-10 DIAGNOSIS — N6310 Unspecified lump in the right breast, unspecified quadrant: Secondary | ICD-10-CM

## 2015-04-10 DIAGNOSIS — R928 Other abnormal and inconclusive findings on diagnostic imaging of breast: Secondary | ICD-10-CM | POA: Insufficient documentation

## 2015-04-16 ENCOUNTER — Ambulatory Visit (INDEPENDENT_AMBULATORY_CARE_PROVIDER_SITE_OTHER): Payer: No Typology Code available for payment source | Admitting: General Surgery

## 2015-04-16 ENCOUNTER — Encounter: Payer: Self-pay | Admitting: General Surgery

## 2015-04-16 VITALS — BP 122/82 | HR 76 | Resp 12 | Ht 64.0 in | Wt 125.0 lb

## 2015-04-16 DIAGNOSIS — N644 Mastodynia: Secondary | ICD-10-CM

## 2015-04-16 DIAGNOSIS — R928 Other abnormal and inconclusive findings on diagnostic imaging of breast: Secondary | ICD-10-CM | POA: Diagnosis not present

## 2015-04-16 NOTE — Progress Notes (Signed)
Patient ID: Lindsey Horn, female   DOB: July 23, 1970, 44 y.o.   MRN: [id number]  Chief Complaint  Patient presents with  . Follow-up    mammogram    HPI Lindsey Horn is a 44 y.o. female who presents for a breast evaluation. The most recent mammogram was done on 04/10/15 .  Patient does perform regular self breast checks and gets regular mammograms done.  No new breast issues.   The patient reports daily bilateral breast discomfort, worse during the time when she would normally be experiencing her menses. She consumes consistently high quantities of caffeine-containing beverages. She was accompanied today by her 65-year-old daughter.  I personally reviewed the history at the patient. HPI  No past medical history on file.  Past Surgical History  Procedure Laterality Date  . Cyst excision  age 50    tongue  . Cesarean section      x3  . Colonoscopy w/ polypectomy    . Hemorroidectomy    . Breast biopsy Right 2015    neg    Family History  Problem Relation Age of Onset  . Breast cancer Maternal Grandmother     Social History Social History  Substance Use Topics  . Smoking status: Current Every Day Smoker -- 1.00 packs/day for 20 years    Types: Cigarettes  . Smokeless tobacco: Never Used  . Alcohol Use: 0.0 oz/week    0 Standard drinks or equivalent per week    No Known Allergies  Current Outpatient Prescriptions  Medication Sig Dispense Refill  . gabapentin (NEURONTIN) 600 MG tablet Take 600 mg by mouth 3 (three) times daily.     . meloxicam (MOBIC) 15 MG tablet Take 15 mg by mouth daily.   11  . traZODone (DESYREL) 100 MG tablet Take 100 mg by mouth as needed.     . valACYclovir (VALTREX) 500 MG tablet Take by mouth.    Marland Kitchen VYVANSE 40 MG capsule Take 40 mg by mouth every morning.  0   No current facility-administered medications for this visit.    Review of Systems Review of Systems  Constitutional: Negative.   Respiratory: Negative.     Blood pressure  122/82, pulse 76, resp. rate 12, height  (1.626 m), weight 125 lb (56.7 kg).  Physical Exam Physical Exam  Constitutional: She is oriented to person, place, and time. She appears well-developed and well-nourished.  Eyes: Conjunctivae are normal. No scleral icterus.  Neck: Neck supple.  Cardiovascular: Normal rate, regular rhythm and normal heart sounds.   Pulmonary/Chest: Effort normal and breath sounds normal. Right breast exhibits tenderness. Right breast exhibits no inverted nipple, no mass, no nipple discharge and no skin change. Left breast exhibits tenderness. Left breast exhibits no inverted nipple, no mass, no nipple discharge and no skin change.  Lymphadenopathy:    She has no cervical adenopathy.    She has no axillary adenopathy.  Neurological: She is alert and oriented to person, place, and time.  Skin: Skin is warm and dry.    Data Reviewed Mammograms dated 04/10/2015 were reviewed. BI-RADS-3. Short-term follow-up recommended for the right breast. Asymmetric breast parenchyma noted.  Assessment    9 breast exam, previous benign biopsies.  Mastalgia likely secondary to caffeine stimulation.    Plan    Ryland antioxidants with  Ocuvite twice a day recommended. Efforts to minimize caffeine consumption encouraged.    The patient has been asked to return to the office in six months with a right  diagnostic mammogram.  PCP: Trudie BucklerWhite, Elizabeth    Joy Haegele W 04/17/2015, 6:09 AM

## 2015-04-16 NOTE — Patient Instructions (Signed)
The patient has been asked to return to the office in six months with a right diagnostic mammogram. 

## 2015-04-17 DIAGNOSIS — N644 Mastodynia: Secondary | ICD-10-CM | POA: Insufficient documentation

## 2015-04-18 ENCOUNTER — Ambulatory Visit: Payer: No Typology Code available for payment source | Admitting: General Surgery

## 2015-07-31 ENCOUNTER — Other Ambulatory Visit: Payer: Self-pay | Admitting: *Deleted

## 2015-07-31 DIAGNOSIS — R928 Other abnormal and inconclusive findings on diagnostic imaging of breast: Secondary | ICD-10-CM

## 2015-08-19 ENCOUNTER — Encounter: Payer: Self-pay | Admitting: *Deleted

## 2015-10-09 ENCOUNTER — Other Ambulatory Visit: Payer: Self-pay | Admitting: General Surgery

## 2015-10-09 ENCOUNTER — Ambulatory Visit
Admission: RE | Admit: 2015-10-09 | Discharge: 2015-10-09 | Disposition: A | Discharge status: Home / Self Care | Payer: Self-pay | Source: Ambulatory Visit | Attending: General Surgery | Admitting: General Surgery

## 2015-10-09 DIAGNOSIS — R928 Other abnormal and inconclusive findings on diagnostic imaging of breast: Secondary | ICD-10-CM

## 2015-10-14 ENCOUNTER — Telehealth: Payer: Self-pay | Admitting: *Deleted

## 2015-10-14 NOTE — Telephone Encounter (Signed)
Message for patient to call the office.   Patient is scheduled for a follow up appointment with Dr. Lemar LivingsByrnett on Wednesday, 10-16-15. Need to confirm patient does not have insurance at this time.   If not, will need to see if she can contact [facility] to see if she would qualify before appointment with Dr. Lemar LivingsByrnett.

## 2015-10-14 NOTE — Telephone Encounter (Signed)
-----   Message from Anell BarrVanessa L Muldrew, [place] sent at 10/11/2015  1:17 PM EDT ----- [place] CasterHello Lindsey Horn, This patient dose not have insurance and wants to check on this prior to having a biopsy done. [place].

## 2015-10-15 NOTE — Telephone Encounter (Signed)
PT RET OUR CALL. SHE WILL BE SEEN 10-16-15 TO SEE IF SHE QUALIFIES FOR [facility] & SEE DR BYRNETT THE FOLLOWING WK.

## 2015-10-16 ENCOUNTER — Ambulatory Visit: Payer: Self-pay | Attending: Oncology

## 2015-10-16 ENCOUNTER — Ambulatory Visit: Payer: No Typology Code available for payment source | Admitting: General Surgery

## 2015-10-16 VITALS — BP 140/105 | HR 105 | Temp 98.9°F | Ht 66.14 in | Wt 125.2 lb

## 2015-10-16 DIAGNOSIS — N63 Unspecified lump in unspecified breast: Secondary | ICD-10-CM

## 2015-10-16 NOTE — Progress Notes (Signed)
Subjective:     Patient ID: Lindsey Horn, female   DOB: Dec 14, 1970, 45 y.o.   MRN: [id number]  HPI   Review of Systems     Objective:   Physical Exam  Pulmonary/Chest: Right breast exhibits no inverted nipple, no mass, no nipple discharge, no skin change and no tenderness. Left breast exhibits no inverted nipple, no mass, no nipple discharge, no skin change and no tenderness. Breasts are symmetrical.         Assessment:     45 year old [facility] Patient screened, and meets [facility] eligibility.  Patient does not have insurance, Medicare or Medicaid.  Handout given on Affordable Care Act.  Instructed patient on breast self-exam using teach back method.  Patient has been followed by Dr. Lemar LivingsByrnett, for a right breast nodule.  She has had mammograms every 6 months for last 3 years to assess changes in this area.  Last mammogram on May 3rd, 2017, paid for with [employer], had Birads 4 results.  Dr. Lemar LivingsByrnett would like to send for tomosynthesis guided biopsy.   Palpated a firm mobile nodule lower inner quadrant of right breast on clinical breast exam.     Plan:  Dr. Lemar LivingsByrnett to see patient on 10/22/15, and his office will schedule biopsy.

## 2015-10-22 ENCOUNTER — Ambulatory Visit: Payer: No Typology Code available for payment source | Admitting: General Surgery

## 2015-10-24 ENCOUNTER — Encounter: Payer: Self-pay | Admitting: General Surgery

## 2015-10-24 ENCOUNTER — Ambulatory Visit (INDEPENDENT_AMBULATORY_CARE_PROVIDER_SITE_OTHER): Payer: PRIVATE HEALTH INSURANCE | Admitting: General Surgery

## 2015-10-24 VITALS — BP 118/70 | HR 89 | Resp 14 | Ht 64.0 in | Wt 126.0 lb

## 2015-10-24 DIAGNOSIS — N63 Unspecified lump in breast: Secondary | ICD-10-CM

## 2015-10-24 DIAGNOSIS — R928 Other abnormal and inconclusive findings on diagnostic imaging of breast: Secondary | ICD-10-CM | POA: Diagnosis not present

## 2015-10-24 DIAGNOSIS — N6310 Unspecified lump in the right breast, unspecified quadrant: Secondary | ICD-10-CM

## 2015-10-24 NOTE — Patient Instructions (Signed)
Continue self breast exams. Call office for any new breast issues or concerns. 

## 2015-10-24 NOTE — Progress Notes (Signed)
Patient ID: Lindsey ClinchRhonda K Kempker, female   DOB: 12-02-1970, 45 y.o.   MRN: [id number]  Chief Complaint  Patient presents with  . Follow-up    mammogam    HPI Lindsey ClinchRhonda K Mcalexander is a 45 y.o. female who presents for a breast evaluation. The most recent mammogram was done on 10/09/15. Patient does perform regular self breast checks and gets regular mammograms done. she reports no new problems with the breasts.     I personally reviewed the patient's history.   HPI  Past Medical History  Diagnosis Date  . ADD (attention deficit disorder)     Past Surgical History  Procedure Laterality Date  . Cyst excision  age 45    tongue  . Cesarean section      x3  . Colonoscopy w/ polypectomy  20 years ago  . Hemorroidectomy    . Breast biopsy Right 2015    neg    Family History  Problem Relation Age of Onset  . Breast cancer Maternal Grandmother   . Bone cancer Paternal Grandmother   . Cancer Paternal Uncle     Social History Social History  Substance Use Topics  . Smoking status: Current Every Day Smoker -- 1.00 packs/day for 20 years    Types: Cigarettes  . Smokeless tobacco: Never Used  . Alcohol Use: 0.0 oz/week    0 Standard drinks or equivalent per week    No Known Allergies  Current Outpatient Prescriptions  Medication Sig Dispense Refill  . gabapentin (NEURONTIN) 600 MG tablet Take 600 mg by mouth 3 (three) times daily.     Marland Kitchen. VYVANSE 40 MG capsule Take 40 mg by mouth every morning.  0   No current facility-administered medications for this visit.    Review of Systems Review of Systems  Constitutional: Negative.   Respiratory: Negative.   Cardiovascular: Negative.     Blood pressure 118/70, pulse 89, resp. rate 14, height 5\' 4"  (1.626 m), weight 126 lb (57.153 kg).  Physical Exam Physical Exam  Constitutional: She is oriented to person, place, and time. She appears well-developed and well-nourished.  Eyes: Conjunctivae are normal. No scleral icterus.  Neck: Neck  supple.  Cardiovascular: Normal rate, regular rhythm and normal heart sounds.   Pulmonary/Chest: Effort normal and breath sounds normal. Right breast exhibits no inverted nipple, no mass, no nipple discharge, no skin change and no tenderness. Left breast exhibits no inverted nipple, no mass, no nipple discharge, no skin change and no tenderness.  Lymphadenopathy:    She has no cervical adenopathy.  Neurological: She is alert and oriented to person, place, and time.  Skin: Skin is warm and dry.  Psychiatric: She has a normal mood and affect.    Data Reviewed Dedicated right breast mammogram dated 10/09/2015 was reviewed including tomographic images.  No interval change from 04/30/2014, new finding compared to plain digital mammograms dated March 2014. BI-RADS-4.  Assessment    Abnormal tomographic imaging of the breast, no interval change in 18 months.    Plan    Options for management were reviewed: 1) continued observation versus 2) [place].  At this time, the patient remains comfortable with observation. We'll arrange for bilateral diagnostic mammograms in 6 months. She is encouraged to call if she has any concerns in the interval. PCP: Doristine MangoElizabeth White This has been scribed by Sinda Duaryl-Lyn M Kennedy LPN     Earline MayotteByrnett, Parish Dubose W 10/26/2015, 10:45 AM

## 2015-12-01 NOTE — Progress Notes (Unsigned)
Per Dr. Lemar LivingsByrnett, area of concern unchanged since 2014.  Recommends six month follow-up with patient in agreement.  Will schedule bilateral diagnostic mammogram, and ultrasound through [facility].

## 2015-12-24 ENCOUNTER — Other Ambulatory Visit: Payer: Self-pay

## 2015-12-24 DIAGNOSIS — N63 Unspecified lump in unspecified breast: Secondary | ICD-10-CM

## 2015-12-24 NOTE — Progress Notes (Signed)
Patient scheduled for 6 month follow-up bilateral diagnostic mammogram, and ultrasound on 04/13/16 at 1:20 p.m.  Mailed appointment date and time to patient.  Dr. Rutherford NailByrnett's office does not schedule out this far, but notified mammogram is scheduled.

## 2016-03-04 ENCOUNTER — Encounter: Payer: Self-pay | Admitting: *Deleted

## 2016-04-08 ENCOUNTER — Ambulatory Visit: Payer: Self-pay

## 2016-04-08 ENCOUNTER — Ambulatory Visit: Payer: Self-pay | Attending: Oncology

## 2016-04-13 ENCOUNTER — Ambulatory Visit
Admission: RE | Admit: 2016-04-13 | Discharge: 2016-04-13 | Disposition: A | Discharge status: Home / Self Care | Payer: Self-pay | Source: Ambulatory Visit | Attending: Internal Medicine | Admitting: Internal Medicine

## 2016-04-13 DIAGNOSIS — N63 Unspecified lump in unspecified breast: Secondary | ICD-10-CM

## 2016-04-14 ENCOUNTER — Telehealth: Payer: Self-pay | Admitting: *Deleted

## 2016-04-14 NOTE — Telephone Encounter (Signed)
Called patient and discussed her abnormal mammogram results.  Reminded her of her follow-up appointment with Dr. Lemar LivingsByrnett on 04/22/16 @ 11:30.  Informed her that she and Dr. Lemar LivingsByrnett will decide next steps in regards to her mammogram.  Will follow-up per [facility] protocol.

## 2016-04-22 ENCOUNTER — Ambulatory Visit: Payer: PRIVATE HEALTH INSURANCE | Admitting: General Surgery

## 2016-05-11 ENCOUNTER — Ambulatory Visit (INDEPENDENT_AMBULATORY_CARE_PROVIDER_SITE_OTHER): Payer: PRIVATE HEALTH INSURANCE | Admitting: General Surgery

## 2016-05-11 ENCOUNTER — Encounter: Payer: Self-pay | Admitting: General Surgery

## 2016-05-11 VITALS — BP 120/78 | HR 82 | Resp 12 | Ht 62.0 in | Wt 124.0 lb

## 2016-05-11 DIAGNOSIS — N6310 Unspecified lump in the right breast, unspecified quadrant: Secondary | ICD-10-CM

## 2016-05-11 NOTE — Progress Notes (Signed)
Patient ID: Lindsey Horn, female   DOB: [birth year], 45 y.o.   MRN: [id number]  Chief Complaint  Patient presents with  . Follow-up    HPI Lindsey Horn is a 45 y.o. female who presents for a breast evaluation. The most recent mammogram was done on 04/13/2016.  Patient does perform regular self breast checks and gets regular mammograms done.    HPI  Past Medical History:  Diagnosis Date  . ADD (attention deficit disorder)     Past Surgical History:  Procedure Laterality Date  . BREAST BIOPSY Right 2015   neg x 2  . CESAREAN SECTION     x3  . COLONOSCOPY W/ POLYPECTOMY  20 years ago  . CYST EXCISION  age 45   tongue  . HEMORROIDECTOMY      Family History  Problem Relation Age of Onset  . Breast cancer Maternal Grandmother   . Bone cancer Paternal Grandmother   . Cancer Paternal Uncle     Social History Social History  Substance Use Topics  . Smoking status: Current Every Day Smoker    Packs/day: 1.00    Years: 20.00    Types: Cigarettes  . Smokeless tobacco: Never Used  . Alcohol use 0.0 oz/week    No Known Allergies  Current Outpatient Prescriptions  Medication Sig Dispense Refill  . gabapentin (NEURONTIN) 600 MG tablet Take 600 mg by mouth 3 (three) times daily.     Marland Kitchen. VYVANSE 40 MG capsule Take 50 mg by mouth every morning.   0   No current facility-administered medications for this visit.     Review of Systems Review of Systems  Constitutional: Negative.   Respiratory: Negative.   Cardiovascular: Negative.     Blood pressure 120/78, pulse 82, resp. rate 12, height 5\' 2"  (1.575 m), weight 124 lb (56.2 kg).  Physical Exam Physical Exam  Constitutional: She is oriented to person, place, and time. She appears well-developed and well-nourished.  Eyes: Conjunctivae are normal. No scleral icterus.  Neck: Neck supple.  Cardiovascular: Normal rate, regular rhythm and normal heart sounds.   Pulmonary/Chest: Effort normal and breath sounds normal.  Right breast exhibits no inverted nipple, no mass, no nipple discharge, no skin change and no tenderness. Left breast exhibits no inverted nipple, no mass, no nipple discharge, no skin change and no tenderness.  Abdominal: Soft. Bowel sounds are normal. There is no tenderness.  Lymphadenopathy:    She has no cervical adenopathy.    She has no axillary adenopathy.  Neurological: She is alert and oriented to person, place, and time.  Skin: Skin is warm and dry.    Data Reviewed Bilateral mammograms dated 04/13/2016 were reviewed. Stable distortion in the right breast. BI-RADS-4.  Assessment    Benign breast exam, no interval change in mammogram.      Plan    Instrument were were reviewed: 1) early biopsy versus 2) continued observation.  If biopsy were to be contemplated, with discussed with radiology whether they would recommend formal excision regardless of the 3-D stereo tomo synthesis biopsy results. If the answer would be in the affirmative would proceed to needle localization open surgical excision.  At this time the patient and I both comfortable with ongoing observation.      The patient has been asked to return to the office in one year with a bilateral diagnostic mammogram.  This information has been scribed by Ples SpecterJessica Qualls CMA.  Earline MayotteByrnett, Judah Chevere W 05/11/2016, 8:09 PM

## 2016-05-11 NOTE — Patient Instructions (Signed)
The patient has been asked to return to the office in one year with a bilateral diagnostic mammogram. 

## 2016-05-14 ENCOUNTER — Encounter: Payer: Self-pay | Admitting: *Deleted

## 2016-05-14 NOTE — Progress Notes (Signed)
Patient seen by Dr. Lemar LivingsByrnett and they have agreed to close follow-up instead of biopsy.   [facility] @ 12:30 per recommendations.  Letter mailed to inform patient of her appointment.  HSIS to [place].

## 2016-07-24 ENCOUNTER — Ambulatory Visit
Admission: RE | Admit: 2016-07-24 | Discharge: 2016-07-24 | Disposition: A | Discharge status: Home / Self Care | Payer: Managed Care, Other (non HMO) | Source: Ambulatory Visit | Attending: Medical Oncology | Admitting: Medical Oncology

## 2016-07-24 ENCOUNTER — Other Ambulatory Visit: Payer: Self-pay | Admitting: Medical Oncology

## 2016-07-24 DIAGNOSIS — Z720 Tobacco use: Secondary | ICD-10-CM | POA: Diagnosis not present

## 2016-07-24 DIAGNOSIS — R0989 Other specified symptoms and signs involving the circulatory and respiratory systems: Secondary | ICD-10-CM | POA: Insufficient documentation

## 2016-07-24 DIAGNOSIS — J111 Influenza due to unidentified influenza virus with other respiratory manifestations: Secondary | ICD-10-CM

## 2016-07-24 DIAGNOSIS — R0981 Nasal congestion: Secondary | ICD-10-CM | POA: Insufficient documentation

## 2016-07-24 DIAGNOSIS — R05 Cough: Secondary | ICD-10-CM | POA: Diagnosis not present

## 2017-04-14 ENCOUNTER — Ambulatory Visit: Payer: 59 | Attending: Oncology

## 2017-04-21 ENCOUNTER — Ambulatory Visit: Payer: PRIVATE HEALTH INSURANCE | Admitting: General Surgery

## 2017-08-29 ENCOUNTER — Ambulatory Visit (INDEPENDENT_AMBULATORY_CARE_PROVIDER_SITE_OTHER): Payer: Self-pay

## 2017-08-29 ENCOUNTER — Other Ambulatory Visit: Payer: Self-pay

## 2017-08-29 ENCOUNTER — Ambulatory Visit
Admission: EM | Admit: 2017-08-29 | Discharge: 2017-08-29 | Disposition: A | Discharge status: Home / Self Care | Payer: Self-pay | Attending: Family Medicine | Admitting: Family Medicine

## 2017-08-29 DIAGNOSIS — R030 Elevated blood-pressure reading, without diagnosis of hypertension: Secondary | ICD-10-CM

## 2017-08-29 DIAGNOSIS — M25571 Pain in right ankle and joints of right foot: Secondary | ICD-10-CM

## 2017-08-29 DIAGNOSIS — M25461 Effusion, right knee: Secondary | ICD-10-CM

## 2017-08-29 DIAGNOSIS — M25561 Pain in right knee: Secondary | ICD-10-CM

## 2017-08-29 DIAGNOSIS — M545 Low back pain: Secondary | ICD-10-CM

## 2017-08-29 DIAGNOSIS — G8929 Other chronic pain: Secondary | ICD-10-CM

## 2017-08-29 DIAGNOSIS — M79641 Pain in right hand: Secondary | ICD-10-CM

## 2017-08-29 DIAGNOSIS — R5383 Other fatigue: Secondary | ICD-10-CM

## 2017-08-29 MED ORDER — NAPROXEN 500 MG PO TABS: 500.0000 mg | ORAL_TABLET | Freq: Two times a day (BID) | ORAL | 0 refills | Status: AC | PRN

## 2017-08-29 NOTE — ED Triage Notes (Signed)
Pt reports hand pain swelling x 2 months along with intermittent ankle swelling and knee swelling. Sometimes becomes SOB. She reports today the ankle and knees aren't swollen. Pain 6/10, States standing a lot exacerbates the issue

## 2017-08-29 NOTE — Discharge Instructions (Addendum)
Recommend trial Naproxen 500mg  twice a day as needed for pain and swelling. Recommend either go to your PCP for further testing (labwork and imaging) or to the ER for further evaluation of all your symptoms.

## 2017-08-30 NOTE — ED Provider Notes (Signed)
[facility]    CSN: [id number] Arrival date & time: 08/29/17  1422     History   Chief Complaint Chief Complaint  Patient presents with  . Hand Pain    HPI Lindsey Horn is a 47 y.o. female.   47 year old female presents with multiple concerns. She complains of her right hand becoming swollen and painful for over 2 months. Also noticed some tingling in her fingers when the swelling is more severe. Also complains of right knee and ankle pain and swelling that becomes worse when she stands at work. No known trauma. She feels tired all the time and has occasional shortness of breath. She denies any chest pain, fever, weight loss or numbness. She has a history of chronic back pain and currently on Neurontin daily. She also has insomnia and takes Trazodone which is not helpful. She continues to smoke a pack a day and uses Albuterol prn. She currently does not have insurance and is unable to see her PCP due to cost. She has not had labwork or other workup due to finances. She went to [facility] ER in [place] yesterday but left after 2 hours without being seen. Her blood pressure was significantly elevated there at 170/107 and is elevated today. She has tried Advil for pain with no relief. She is teary and wants to find out now the reason for all her symptoms.   The history is provided by the patient.    Past Medical History:  Diagnosis Date  . ADD (attention deficit disorder)     Patient Active Problem List   Diagnosis Date Noted  . Mastalgia 04/17/2015  . Abnormal mammogram of right breast 05/10/2014    Past Surgical History:  Procedure Laterality Date  . ABDOMINAL HYSTERECTOMY    . BREAST BIOPSY Right 2015   neg x 2  . CESAREAN SECTION     x3  . COLONOSCOPY W/ POLYPECTOMY  20 years ago  . CYST EXCISION  age 1   tongue  . HEMORROIDECTOMY      OB History    Gravida  6   Para  3   Term      Preterm      AB  3   Living        SAB  3   TAB      Ectopic      Multiple      Live Births           Obstetric Comments  1st Menstrual Cycle:  8  1st Pregnancy:  21         Home Medications    Prior to Admission medications   Medication Sig Start Date End Date Taking? Authorizing Provider  albuterol (PROVENTIL HFA) 108 (90 Base) MCG/ACT inhaler Inhale into the lungs. 08/13/16  Yes [provider]  gabapentin (NEURONTIN) 600 MG tablet Take 600 mg by mouth 3 (three) times daily.  05/06/14   [provider]  naproxen (NAPROSYN) 500 MG tablet Take 1 tablet (500 mg total) by mouth 2 (two) times daily as needed for moderate pain. 08/29/17   Sudie Grumbling, NP  traZODone (DESYREL) 150 MG tablet Take 150 mg by mouth.    [provider]    Family History Family History  Problem Relation Age of Onset  . Breast cancer Maternal Grandmother   . Bone cancer Paternal Grandmother   . Cancer Paternal Uncle     Social History Social History  Tobacco Use  . Smoking status: Current Every Day Smoker    Packs/day: 1.00    Years: 20.00    Pack years: 20.00    Types: Cigarettes  . Smokeless tobacco: Never Used  Substance Use Topics  . Alcohol use: Yes    Alcohol/week: 0.0 oz    Comment: social  . Drug use: No     Allergies   Patient has no known allergies.   Review of Systems Review of Systems  Constitutional: Positive for fatigue. Negative for activity change, appetite change, chills and fever.  Respiratory: Positive for cough and shortness of breath. Negative for chest tightness and wheezing.   Cardiovascular: Negative for chest pain and palpitations.  Gastrointestinal: Negative for abdominal pain, nausea and vomiting.  Genitourinary: Negative for dysuria, flank pain, hematuria and urgency.  Musculoskeletal: Positive for arthralgias, back pain, joint swelling, myalgias and neck pain. Negative for neck stiffness.  Skin: Negative for rash and wound.  Neurological: Positive for weakness and headaches.  Negative for dizziness, tremors, seizures, syncope, speech difficulty, light-headedness and numbness.  Hematological: Negative for adenopathy. Does not bruise/bleed easily.  Psychiatric/Behavioral: Positive for sleep disturbance.     Physical Exam Triage Vital Signs ED Triage Vitals  Enc Vitals Group     BP 08/29/17 1445 (!) 155/102     Pulse Rate 08/29/17 1445 86     Resp 08/29/17 1445 16     Temp 08/29/17 1445 97.7 F (36.5 C)     Temp Source 08/29/17 1445 Oral     SpO2 08/29/17 1445 100 %     Weight 08/29/17 1446 125 lb (56.7 kg)     Height 08/29/17 1446 5\' 4"  (1.626 m)     Head Circumference --      Peak Flow --      Pain Score 08/29/17 1446 6     Pain Loc --      Pain Edu? --      Excl. in GC? --    No data found.  Updated Vital Signs BP (!) 155/102 (BP Location: Right Arm)   Pulse 86   Temp 97.7 F (36.5 C) (Oral)   Resp 16   Ht 5\' 4"  (1.626 m)   Wt 125 lb (56.7 kg)   LMP  (LMP Unknown)   SpO2 100%   BMI 21.46 kg/m   Visual Acuity Right Eye Distance:   Left Eye Distance:   Bilateral Distance:    Right Eye Near:   Left Eye Near:    Bilateral Near:     Physical Exam  Constitutional: She is oriented to person, place, and time. She appears well-developed and well-nourished. She is cooperative. No distress.  She is sitting on exam table occasionally crying  HENT:  Head: Normocephalic and atraumatic.  Right Ear: Hearing and external ear normal.  Left Ear: Hearing and external ear normal.  Nose: Nose normal.  Eyes: EOM are normal. Right conjunctiva is injected (due to crying). Left conjunctiva is injected.  Neck: Normal range of motion.  Cardiovascular: Normal rate, regular rhythm and normal heart sounds.  No murmur heard. Pulmonary/Chest: Effort normal. No stridor. No respiratory distress. She has decreased breath sounds in the right upper field, the right lower field, the left upper field and the left lower field. She has no wheezes. She has no rhonchi.  She has no rales.  Musculoskeletal: Normal range of motion. She exhibits tenderness.       Right knee: She exhibits normal range of motion, no swelling,  no effusion, no ecchymosis, no deformity, no erythema, normal alignment, no LCL laxity and normal patellar mobility. Tenderness found. Medial joint line and lateral joint line tenderness noted.       Right ankle: She exhibits normal range of motion, no swelling, no ecchymosis, no deformity, no laceration and normal pulse. Tenderness. Lateral malleolus and medial malleolus tenderness found. Achilles tendon normal.       Right hand: She exhibits tenderness and swelling. She exhibits normal range of motion, normal capillary refill, no deformity and no laceration. Normal sensation noted. Decreased strength noted. She exhibits thumb/finger opposition.       Hands:      Legs: Has full range of motion of right hand and fingers but pain with any movement. 4th finger proximal joint is slightly swollen and more tender than other joints. Has difficulty with making a fist and thumb/finger opposition due to pain. No distinct neuro deficits noted. Good pulses and capillary refill.   Full range of motion of right knee and ankle. Very tender over entire knee and ankle. No distinct swelling. No redness or warmth. No neuro deficits noted. Good pulses and distal capillary refill.   Lymphadenopathy:    She has no cervical adenopathy.  Neurological: She is alert and oriented to person, place, and time. She has normal strength and normal reflexes. No sensory deficit.  Skin: Skin is warm and dry. Capillary refill takes less than 2 seconds. No rash noted. No erythema.  Psychiatric: Her speech is normal. Judgment and thought content normal. Her mood appears anxious. Her affect is angry. She is agitated. Cognition and memory are impaired.     UC Treatments / Results  Labs (all labs ordered are listed, but only abnormal results are displayed) Labs Reviewed - No data to  display  EKG None Radiology Dg Knee Complete 4 Views Right  Result Date: 08/29/2017 CLINICAL DATA:  Diffuse right knee pain for 2 months without known trauma. EXAM: RIGHT KNEE - COMPLETE 4+ VIEW COMPARISON:  None. FINDINGS: No evidence of fracture, dislocation, or joint effusion. No evidence of arthropathy or other focal bone abnormality. Soft tissues are unremarkable. IMPRESSION: Negative. Electronically Signed   By: Kennith Center M.D.   On: 08/29/2017 16:48   Dg Hand Complete Right  Result Date: 08/29/2017 CLINICAL DATA:  Right hand and finger pain for 2 months. EXAM: RIGHT HAND - COMPLETE 3+ VIEW COMPARISON:  None. FINDINGS: There is no evidence of fracture or dislocation. There is no evidence of arthropathy or other focal bone abnormality. Soft tissues are unremarkable. IMPRESSION: Negative. Electronically Signed   By: Kennith Center M.D.   On: 08/29/2017 16:50    Procedures Procedures (including critical care time)  Medications Ordered in UC Medications - No data to display   Initial Impression / Assessment and Plan / UC Course  I have reviewed the triage vital signs and the nursing notes.  Pertinent labs & imaging results that were available during my care of the patient were reviewed by me and considered in my medical decision making (see chart for details).    Discussed with patient that she has too many concerns for all of them to be evaluated at this visit. Discussed that we will x-ray her hand and knee since these are her main concerns. Reviewed negative x-rays with patient. Discussed that there are multiple etiologies that could be causing all of her symptoms, which could include autoimmune disorder or chronic fatigue syndrome. She needs extensive blood work as well as other  imaging (chest x-ray/echo) for her shortness of breath and swelling. Patient is stable. Discussed that I am unable to prescribe Prednisone for pain and swelling due to very elevated uncontrolled blood  pressure. She may trial Naproxen 500mg  twice a day as directed for pain. Note written for work. Discussed that she either needs to see her PCP or [facility] for further evaluation of all her concerns.   Final Clinical Impressions(s) / UC Diagnoses   Final diagnoses:  Right hand pain  Pain and swelling of right knee  Fatigue, unspecified type  Pain in right ankle and joints of right foot  Elevated blood pressure reading without diagnosis of hypertension  Chronic bilateral low back pain, with sciatica presence unspecified    ED Discharge Orders        Ordered    naproxen (NAPROSYN) 500 MG tablet  2 times daily PRN     08/29/17 1708       Controlled Substance Prescriptions [place] Controlled Substance Registry consulted? Yes, I have consulted the [place] Controlled Substances Registry for this patient. She is currently on medication for ADHD- Dextrolamp-Amphetamine ER 20mg  as well as short release 20mg  which was last prescribed 08/04/17 and has been prescribed monthly since Oct 2018. Previously on Vyvanse in 2018. Has been prescribed ADHD medication by 3 different providers in the past 2 years. No current narcotic medications.    Sudie GrumblingAmyot, Dawnita Molner Berry, NP 08/30/17 [phone]

## 2018-12-23 ENCOUNTER — Other Ambulatory Visit: Payer: Self-pay

## 2018-12-23 DIAGNOSIS — Z20822 Contact with and (suspected) exposure to covid-19: Secondary | ICD-10-CM

## 2018-12-27 LAB — NOVEL CORONAVIRUS, NAA: SARS-CoV-2, NAA: NOT DETECTED

## 2019-01-02 ENCOUNTER — Telehealth: Payer: Self-pay

## 2019-01-02 NOTE — Telephone Encounter (Signed)
Patient called to check status of Covid-19  Results.  Advised patient results are negative.

## 2019-07-24 ENCOUNTER — Other Ambulatory Visit: Payer: Self-pay | Admitting: Gastroenterology

## 2019-07-24 DIAGNOSIS — R1313 Dysphagia, pharyngeal phase: Secondary | ICD-10-CM

## 2019-07-31 ENCOUNTER — Ambulatory Visit: Payer: Self-pay

## 2019-09-18 ENCOUNTER — Other Ambulatory Visit: Payer: Self-pay

## 2019-09-18 DIAGNOSIS — Z1231 Encounter for screening mammogram for malignant neoplasm of breast: Secondary | ICD-10-CM

## 2019-09-21 ENCOUNTER — Ambulatory Visit
Admission: RE | Admit: 2019-09-21 | Discharge: 2019-09-21 | Disposition: A | Discharge status: Home / Self Care | Payer: Medicaid Other | Source: Ambulatory Visit | Attending: Gastroenterology | Admitting: Gastroenterology

## 2019-09-21 ENCOUNTER — Other Ambulatory Visit: Payer: Self-pay

## 2019-09-21 DIAGNOSIS — R1313 Dysphagia, pharyngeal phase: Secondary | ICD-10-CM | POA: Diagnosis not present

## 2019-10-20 ENCOUNTER — Ambulatory Visit
Admission: EM | Admit: 2019-10-20 | Discharge: 2019-10-20 | Disposition: A | Discharge status: Home / Self Care | Payer: Medicaid Other | Attending: Family Medicine | Admitting: Family Medicine

## 2019-10-20 ENCOUNTER — Encounter: Payer: Self-pay | Admitting: Emergency Medicine

## 2019-10-20 ENCOUNTER — Other Ambulatory Visit: Payer: Self-pay

## 2019-10-20 DIAGNOSIS — R197 Diarrhea, unspecified: Secondary | ICD-10-CM | POA: Diagnosis not present

## 2019-10-20 DIAGNOSIS — R0781 Pleurodynia: Secondary | ICD-10-CM

## 2019-10-20 DIAGNOSIS — R1032 Left lower quadrant pain: Secondary | ICD-10-CM

## 2019-10-20 NOTE — ED Provider Notes (Signed)
[facility] ____________________________________________  Time seen: Approximately 6:23 PM  I have reviewed the triage vital signs and the nursing notes.   HISTORY  Chief Complaint Diarrhea and Back Pain (left)   HPI Lindsey Horn is a 49 y.o. female presenting for evaluation of diarrhea, vomiting, abdominal pain and rib pain.  Patient reports 2 weeks ago she got up too quickly and became dizzy and cause her to fall forward.  States that she landed on her left ribs.  Reports she was seen at her primary doctor's office after that and had negative rib x-rays.  States she was also seen by her gastroenterologist for swallowing issues and she has a EGD scheduled for June.  Reports all of last week she had intermittent vomiting and since Monday she has been having numerous episodes of diarrhea per day.  Describes diarrhea as watery.  Denies blood or melena in stool.  No vomiting the last 2 days.  Decreased appetite.  Drinking some fluids.  No accompanying fevers.  States she is continued having a lot of rib pain to left posterior lower ribs unresolved by Lidoderm patch.  Also reports she has been having a cough for the last week, occasional congestion.  She says she feels like her ribs pop when she coughs.  Denies fevers, current shortness of breath or chest pain.  Denies dysuria.  Denies known sick contacts.  Reports she feels weak and tired.  States she is very stressed and have been to care for her 3 grandchildren and her child.  States she has been unable to rest well.  Has smoked marijuana to help with this.  Denies alleviating factors.  Denies suicidal or homicidal ideations.    Past Medical History:  Diagnosis Date  . ADD (attention deficit disorder)     Patient Active Problem List   Diagnosis Date Noted  . Mastalgia 04/17/2015  . Abnormal mammogram of right breast 05/10/2014    Past Surgical History:  Procedure Laterality Date  . ABDOMINAL HYSTERECTOMY    . BREAST  BIOPSY Right 2015   neg x 2  . CESAREAN SECTION     x3  . COLONOSCOPY W/ POLYPECTOMY  20 years ago  . CYST EXCISION  age 55   tongue  . HEMORROIDECTOMY       No current facility-administered medications for this encounter.  Current Outpatient Medications:  .  albuterol (PROVENTIL HFA) 108 (90 Base) MCG/ACT inhaler, Inhale into the lungs., Disp: , Rfl:  .  citalopram (CELEXA) 40 MG tablet, Take by mouth., Disp: , Rfl:  .  gabapentin (NEURONTIN) 600 MG tablet, Take 600 mg by mouth 3 (three) times daily. , Disp: , Rfl:  .  lidocaine (LIDODERM) 5 %, Place onto the skin., Disp: , Rfl:  .  naproxen (NAPROSYN) 500 MG tablet, Take 1 tablet (500 mg total) by mouth 2 (two) times daily as needed for moderate pain., Disp: 30 tablet, Rfl: 0 .  QUEtiapine (SEROQUEL) 50 MG tablet, Take by mouth., Disp: , Rfl:  .  VYVANSE 70 MG capsule, Take 70 mg by mouth daily., Disp: , Rfl:  .  traZODone (DESYREL) 150 MG tablet, Take 150 mg by mouth., Disp: , Rfl:   Allergies Patient has no known allergies.  Family History  Problem Relation Age of Onset  . Breast cancer Maternal Grandmother   . Bone cancer Paternal Grandmother   . Cancer Paternal Uncle     Social History Social History   Tobacco Use  . Smoking  status: Current Every Day Smoker    Packs/day: 1.00    Years: 20.00    Pack years: 20.00    Types: Cigarettes  . Smokeless tobacco: Never Used  Substance Use Topics  . Alcohol use: Yes    Alcohol/week: 0.0 standard drinks    Comment: social  . Drug use: Yes    Types: Marijuana    Review of Systems Constitutional: No fever ENT: No sore throat. Cardiovascular: Denies chest pain. Respiratory: Denies shortness of breath. Gastrointestinal: As above.  Genitourinary: Negative for dysuria. Musculoskeletal: positive for back pain. Skin: Negative for rash.   ____________________________________________   PHYSICAL EXAM:  VITAL SIGNS: ED Triage Vitals  Enc Vitals Group     BP  10/20/19 1724 (!) 139/104     Pulse Rate 10/20/19 1724 (!) 102     Resp 10/20/19 1724 14     Temp 10/20/19 1724 97.9 F (36.6 C)     Temp Source 10/20/19 1724 Oral     SpO2 10/20/19 1724 100 %     Weight 10/20/19 1720 130 lb (59 kg)     Height 10/20/19 1720 5\' 5"  (1.651 m)     Head Circumference --      Peak Flow --      Pain Score 10/20/19 1720 8     Pain Loc --      Pain Edu? --      Excl. in GC? --     Constitutional: Alert and oriented.  Anxious and tearful. Eyes: Conjunctivae are normal.  ENT      Head: Normocephalic Cardiovascular: Normal rate, regular rhythm. Grossly normal heart sounds.  Good peripheral circulation. Respiratory: Normal respiratory effort without tachypnea nor retractions. Breath sounds are clear and equal bilaterally. No wheezes, rales, rhonchi. Gastrointestinal: Normal Bowel sounds.  Mild diffuse abdominal tenderness with moderate left lower quadrant abdominal tenderness to palpation. Musculoskeletal: No midline cervical, thoracic or lumbar tenderness to palpation.  Diffuse moderate left posterior lower rib tenderness to direct palpation, no ecchymosis, no erythema. Neurologic:  Normal speech and language. Speech is normal. No gait instability.  Skin:  Skin is warm, dry and intact. No rash noted. Psychiatric: Mood and affect are normal. Speech and behavior are normal. Patient exhibits appropriate insight and judgment   ___________________________________________   LABS (all labs ordered are listed, but only abnormal results are displayed)  Labs Reviewed - No data to display ____________________________________________   PROCEDURES Procedures     INITIAL IMPRESSION / ASSESSMENT AND PLAN / ED COURSE  Pertinent labs & imaging results that were available during my care of the patient were reviewed by me and considered in my medical decision making (see chart for details).  Anxious and tearful.  Complaining of rib pain abdominal pain diarrhea,  weakness and cough.  Of note patient had rib x-ray on May 4 which is negative for fracture.  Patient has continued with pain as well as GI complaints and abdominal pain.  Patient reports difficulty tolerating foods and fluids.  Recommend further evaluation and possible imaging fluids in the emergency room at this time.  Patient agrees to plan.  States that her family will take her to [facility].  Stable at discharge. ____________________________________________   FINAL CLINICAL IMPRESSION(S) / ED DIAGNOSES  Final diagnoses:  Diarrhea, unspecified type  Left lower quadrant abdominal pain  Rib pain on left side     ED Discharge Orders    None       Note: This dictation was prepared with  Dragon dictation along with smaller Company secretary. Any transcriptional errors that result from this process are unintentional.         Marylene Land, NP 10/20/19 1842

## 2019-10-20 NOTE — Discharge Instructions (Addendum)
Go directly to the emergency room as discussed.  °

## 2019-10-20 NOTE — ED Triage Notes (Signed)
Patient c/o left sided mid to upper back pain that started 2 weeks.  Patient states that she fell 2 weeks ago.  Patient also reports diarrhea for a week.  Patient states that she has been to see several doctors for her symptoms.  Patient states that she smoke "weed" for her pain.

## 2019-11-29 ENCOUNTER — Other Ambulatory Visit
Admission: RE | Admit: 2019-11-29 | Discharge: 2019-11-29 | Disposition: A | Discharge status: Home / Self Care | Payer: Medicaid Other | Source: Ambulatory Visit | Attending: General Surgery | Admitting: General Surgery

## 2019-11-29 ENCOUNTER — Other Ambulatory Visit: Payer: Self-pay

## 2019-11-29 DIAGNOSIS — Z20822 Contact with and (suspected) exposure to covid-19: Secondary | ICD-10-CM | POA: Diagnosis not present

## 2019-11-29 DIAGNOSIS — Z01812 Encounter for preprocedural laboratory examination: Secondary | ICD-10-CM | POA: Insufficient documentation

## 2019-11-29 LAB — SARS CORONAVIRUS 2 (TAT 6-24 HRS): SARS Coronavirus 2: NEGATIVE

## 2019-11-30 ENCOUNTER — Encounter: Payer: Self-pay | Admitting: General Surgery

## 2019-12-01 ENCOUNTER — Ambulatory Visit: Payer: Medicaid Other | Admitting: Anesthesiology

## 2019-12-01 ENCOUNTER — Ambulatory Visit
Admission: RE | Admit: 2019-12-01 | Discharge: 2019-12-01 | Disposition: A | Discharge status: Home / Self Care | Payer: Medicaid Other | Attending: General Surgery | Admitting: General Surgery

## 2019-12-01 ENCOUNTER — Encounter: Payer: Self-pay | Admitting: General Surgery

## 2019-12-01 ENCOUNTER — Other Ambulatory Visit: Payer: Self-pay

## 2019-12-01 ENCOUNTER — Encounter
Admission: RE | Disposition: A | Discharge status: Home / Self Care | Payer: Self-pay | Source: Home / Self Care | Attending: General Surgery

## 2019-12-01 DIAGNOSIS — F419 Anxiety disorder, unspecified: Secondary | ICD-10-CM | POA: Diagnosis not present

## 2019-12-01 DIAGNOSIS — R1310 Dysphagia, unspecified: Secondary | ICD-10-CM | POA: Insufficient documentation

## 2019-12-01 DIAGNOSIS — K2970 Gastritis, unspecified, without bleeding: Secondary | ICD-10-CM | POA: Insufficient documentation

## 2019-12-01 DIAGNOSIS — F988 Other specified behavioral and emotional disorders with onset usually occurring in childhood and adolescence: Secondary | ICD-10-CM | POA: Insufficient documentation

## 2019-12-01 DIAGNOSIS — K449 Diaphragmatic hernia without obstruction or gangrene: Secondary | ICD-10-CM | POA: Diagnosis not present

## 2019-12-01 DIAGNOSIS — K2980 Duodenitis without bleeding: Secondary | ICD-10-CM | POA: Diagnosis not present

## 2019-12-01 DIAGNOSIS — K228 Other specified diseases of esophagus: Secondary | ICD-10-CM | POA: Diagnosis not present

## 2019-12-01 DIAGNOSIS — K2100 Gastro-esophageal reflux disease with esophagitis, without bleeding: Secondary | ICD-10-CM | POA: Insufficient documentation

## 2019-12-01 DIAGNOSIS — K269 Duodenal ulcer, unspecified as acute or chronic, without hemorrhage or perforation: Secondary | ICD-10-CM | POA: Diagnosis not present

## 2019-12-01 DIAGNOSIS — F329 Major depressive disorder, single episode, unspecified: Secondary | ICD-10-CM | POA: Insufficient documentation

## 2019-12-01 DIAGNOSIS — Z79899 Other long term (current) drug therapy: Secondary | ICD-10-CM | POA: Insufficient documentation

## 2019-12-01 DIAGNOSIS — F17210 Nicotine dependence, cigarettes, uncomplicated: Secondary | ICD-10-CM | POA: Diagnosis not present

## 2019-12-01 HISTORY — PX: ESOPHAGOGASTRODUODENOSCOPY (EGD) WITH PROPOFOL: SHX5813

## 2019-12-01 HISTORY — DX: Depression, unspecified: F32.A

## 2019-12-01 HISTORY — DX: Anxiety disorder, unspecified: F41.9

## 2019-12-01 LAB — URINE DRUG SCREEN, QUALITATIVE (ARMC ONLY)
Amphetamines, Ur Screen: POSITIVE — AB
Barbiturates, Ur Screen: NOT DETECTED
Benzodiazepine, Ur Scrn: NOT DETECTED
Cannabinoid 50 Ng, Ur ~~LOC~~: POSITIVE — AB
Cocaine Metabolite,Ur ~~LOC~~: NOT DETECTED
MDMA (Ecstasy)Ur Screen: NOT DETECTED
Methadone Scn, Ur: NOT DETECTED
Opiate, Ur Screen: NOT DETECTED
Phencyclidine (PCP) Ur S: NOT DETECTED
Tricyclic, Ur Screen: NOT DETECTED

## 2019-12-01 SURGERY — ESOPHAGOGASTRODUODENOSCOPY (EGD) WITH PROPOFOL
Anesthesia: General

## 2019-12-01 MED ORDER — MIDAZOLAM HCL 2 MG/2ML IJ SOLN
INTRAMUSCULAR | Status: DC | PRN
  Administered 2019-12-01: 2 mg via INTRAVENOUS

## 2019-12-01 MED ORDER — FENTANYL CITRATE (PF) 100 MCG/2ML IJ SOLN
INTRAMUSCULAR | Status: DC | PRN
  Administered 2019-12-01: 50 ug via INTRAVENOUS

## 2019-12-01 MED ORDER — PROPOFOL 500 MG/50ML IV EMUL
INTRAVENOUS | Status: DC | PRN
  Administered 2019-12-01: 150 ug/kg/min via INTRAVENOUS

## 2019-12-01 MED ORDER — SODIUM CHLORIDE 0.9 % IV SOLN: INTRAVENOUS | Status: DC

## 2019-12-01 MED ORDER — FENTANYL CITRATE (PF) 100 MCG/2ML IJ SOLN
INTRAMUSCULAR | Status: AC
Start: 2019-12-01 — End: ?
  Filled 2019-12-01: qty 2

## 2019-12-01 MED ORDER — MIDAZOLAM HCL 2 MG/2ML IJ SOLN
INTRAMUSCULAR | Status: AC
  Filled 2019-12-01: qty 2

## 2019-12-01 MED ORDER — PROPOFOL 500 MG/50ML IV EMUL
INTRAVENOUS | Status: AC
  Filled 2019-12-01: qty 50

## 2019-12-01 NOTE — Anesthesia Preprocedure Evaluation (Signed)
Anesthesia Evaluation  Patient identified by MRN, date of birth, ID band Patient awake    Reviewed: Allergy & Precautions, H&P , NPO status , Patient's Chart, lab work & pertinent test results, reviewed documented beta blocker date and time   History of Anesthesia Complications Negative for: history of anesthetic complications  Airway Mallampati: I  TM Distance: >3 FB Neck ROM: full    Dental  (+) Dental Advidsory Given, Missing, Teeth Intact   Pulmonary neg shortness of breath, COPD (mild), neg recent URI, Current Smoker,    Pulmonary exam normal breath sounds clear to auscultation       Cardiovascular Exercise Tolerance: Good negative cardio ROS Normal cardiovascular exam Rhythm:regular Rate:Normal     Neuro/Psych negative neurological ROS  negative psych ROS   GI/Hepatic negative GI ROS, Neg liver ROS,   Endo/Other  negative endocrine ROS  Renal/GU negative Renal ROS  negative genitourinary   Musculoskeletal   Abdominal   Peds  Hematology negative hematology ROS (+)   Anesthesia Other Findings Past Medical History: No date: ADD (attention deficit disorder) No date: Anxiety No date: Depression   Reproductive/Obstetrics negative OB ROS                             Anesthesia Physical Anesthesia Plan  ASA: II  Anesthesia Plan: General   Post-op Pain Management:    Induction: Intravenous  PONV Risk Score and Plan: 2 and Propofol infusion and TIVA  Airway Management Planned: Natural Airway and Nasal Cannula  Additional Equipment:   Intra-op Plan:   Post-operative Plan:   Informed Consent: I have reviewed the patients History and Physical, chart, labs and discussed the procedure including the risks, benefits and alternatives for the proposed anesthesia with the patient or authorized representative who has indicated his/her understanding and acceptance.     Dental  Advisory Given  Plan Discussed with: Anesthesiologist, CRNA and Surgeon  Anesthesia Plan Comments:         Anesthesia Quick Evaluation

## 2019-12-01 NOTE — H&P (Signed)
Lindsey Horn [id number] [birth year]       HPI:  49 y/o with report of intermittent dysphagia, sense of food not passing area of thoracic inlet.  Unable to tolerate barium swallow secondary to taste of Barocat.  For EGD.   Medications Prior to Admission  Medication Sig Dispense Refill Last Dose  . albuterol (PROVENTIL HFA) 108 (90 Base) MCG/ACT inhaler Inhale into the lungs.     . citalopram (CELEXA) 40 MG tablet Take by mouth.     . gabapentin (NEURONTIN) 600 MG tablet Take 600 mg by mouth 3 (three) times daily.      . naproxen (NAPROSYN) 500 MG tablet Take 1 tablet (500 mg total) by mouth 2 (two) times daily as needed for moderate pain. (Patient not taking: Reported on 12/01/2019) 30 tablet 0 Not Taking at Unknown time  . QUEtiapine (SEROQUEL) 50 MG tablet Take by mouth.     . traZODone (DESYREL) 150 MG tablet Take 150 mg by mouth. (Patient not taking: Reported on 12/01/2019)   Not Taking at Unknown time  . VYVANSE 70 MG capsule Take 70 mg by mouth daily.      No Known Allergies Past Medical History:  Diagnosis Date  . ADD (attention deficit disorder)   . Anxiety   . Depression    Past Surgical History:  Procedure Laterality Date  . ABDOMINAL HYSTERECTOMY    . BREAST BIOPSY Right 2015   neg x 2  . CESAREAN SECTION     x3  . COLONOSCOPY W/ POLYPECTOMY  20 years ago  . CYST EXCISION  age 31   tongue  . HEMORROIDECTOMY     Social History   Socioeconomic History  . Marital status: Single    Spouse name: Not on file  . Number of children: Not on file  . Years of education: Not on file  . Highest education level: Not on file  Occupational History  . Not on file  Tobacco Use  . Smoking status: Current Every Day Smoker    Packs/day: 1.00    Years: 20.00    Pack years: 20.00    Types: Cigarettes  . Smokeless tobacco: Never Used  Vaping Use  . Vaping Use: Never used  Substance and Sexual Activity  . Alcohol use: Yes    Alcohol/week: 0.0 standard drinks    Comment:  social  . Drug use: Yes    Types: Marijuana, Cocaine    Comment: Cocaine use 23yrs ago  . Sexual activity: Not on file  Other Topics Concern  . Not on file  Social History Narrative  . Not on file   Social Determinants of Health   Financial Resource Strain:   . Difficulty of Paying Living Expenses:   Food Insecurity:   . Worried About Programme researcher, broadcasting/film/video in the Last Year:   . Barista in the Last Year:   Transportation Needs:   . Freight forwarder (Medical):   Marland Kitchen Lack of Transportation (Non-Medical):   Physical Activity:   . Days of Exercise per Week:   . Minutes of Exercise per Session:   Stress:   . Feeling of Stress :   Social Connections:   . Frequency of Communication with Friends and Family:   . Frequency of Social Gatherings with Friends and Family:   . Attends Religious Services:   . Active Member of Clubs or Organizations:   . Attends Banker Meetings:   Marland Kitchen Marital Status:   Intimate  Partner Violence:   . Fear of Current or Ex-Partner:   . Emotionally Abused:   Marland Kitchen Physically Abused:   . Sexually Abused:    Social History   Social History Narrative  . Not on file     ROS: Negative.     PE: HEENT: Negative. Lungs: Clear. Cardio: RR.   Assessment/Plan:  Proceed with planned endoscopy.   Forest Gleason [facility] 12/01/2019

## 2019-12-01 NOTE — Transfer of Care (Signed)
Immediate Anesthesia Transfer of Care Note  Patient: Lindsey Horn  Procedure(s) Performed: ESOPHAGOGASTRODUODENOSCOPY (EGD) WITH PROPOFOL (N/A )  Patient Location: PACU  Anesthesia Type:General  Level of Consciousness: awake and sedated  Airway & Oxygen Therapy: Patient Spontanous Breathing and Patient connected to nasal cannula oxygen  Post-op Assessment: Report given to RN and Post -op Vital signs reviewed and stable  Post vital signs: Reviewed and stable  Last Vitals:  Vitals Value Taken Time  BP    Temp    Pulse    Resp    SpO2      Last Pain:  Vitals:   12/01/19 0853  PainSc: 5          Complications: No complications documented.

## 2019-12-01 NOTE — Anesthesia Procedure Notes (Signed)
Performed by: Cook-Martin, Lorrin Bodner Pre-anesthesia Checklist: Patient identified, Emergency Drugs available, Suction available, Patient being monitored and Timeout performed Patient Re-evaluated:Patient Re-evaluated prior to induction Oxygen Delivery Method: Nasal cannula Induction Type: IV induction Airway Equipment and Method: Bite block Placement Confirmation: positive ETCO2 and CO2 detector       

## 2019-12-01 NOTE — Op Note (Signed)
[facility] Patient Name: Lindsey Horn Procedure Date: 12/01/2019 8:54 AM MRN: [id number] Account #: [id number] Date of Birth: [birth year] Admit Type: Outpatient Age: 49 Room: [facility] ENDO ROOM 1 Gender: Female Note Status: Finalized Procedure:             Upper GI endoscopy Indications:           Dysphagia Providers:             Earline Mayotte, MD Referring MD:          Courtney Paris. Cliffton Asters, MD (Referring MD) Medicines:             Monitored Anesthesia Care Complications:         No immediate complications. Procedure:             Pre-Anesthesia Assessment:                        - Prior to the procedure, a History and Physical was                         performed, and patient medications, allergies and                         sensitivities were reviewed. The patient's tolerance                         of previous anesthesia was reviewed.                        - The risks and benefits of the procedure and the                         sedation options and risks were discussed with the                         patient. All questions were answered and informed                         consent was obtained.                        After obtaining informed consent, the endoscope was                         passed under direct vision. Throughout the procedure,                         the patient's blood pressure, pulse, and oxygen                         saturations were monitored continuously. The Endoscope                         was introduced through the mouth, and advanced to the                         second part of duodenum. The upper GI endoscopy was  accomplished without difficulty. The patient tolerated                         the procedure well. 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      A small hiatal hernia was present.      Localized  minimal inflammation was found at the pylorus.      A single localized erosion without bleeding was found in the duodenal       bulb. 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 Biopsy completed.                        - Small hiatal hernia.                        - Gastritis.                        - Duodenal erosion without bleeding.                        - No specimens collected. Recommendation:        - Telephone endoscopist for pathology results in 1                         week. Procedure Code(s):     --- Professional ---                        [phone], Esophagogastroduodenoscopy, flexible,                         transoral; diagnostic, including collection of                         specimen(s) by brushing or washing, when performed                         (separate procedure) Diagnosis Code(s):     --- Professional ---                        R13.10, Dysphagia, unspecified                        K26.9, Duodenal ulcer, unspecified as acute or                         chronic, without hemorrhage or perforation                        K29.70, Gastritis, unspecified, without bleeding                        K44.9, Diaphragmatic hernia without obstruction or  gangrene                        K22.8, Other specified diseases of esophagus CPT copyright 2019 [employer]. All rights reserved. The codes documented in this report are preliminary and upon coder review may  be revised to meet current compliance requirements. Earline Mayotte, MD 12/01/2019 10:33:31 AM This report has been signed electronically. Number of Addenda: 0 Note Initiated On: 12/01/2019 8:54 AM Estimated Blood Loss:  Estimated blood loss: none.      [facility]

## 2019-12-01 NOTE — Anesthesia Postprocedure Evaluation (Signed)
Anesthesia Post Note  Patient: Lindsey Horn  Procedure(s) Performed: ESOPHAGOGASTRODUODENOSCOPY (EGD) WITH PROPOFOL (N/A )  Patient location during evaluation: Endoscopy Anesthesia Type: General Level of consciousness: awake and alert Pain management: pain level controlled Vital Signs Assessment: post-procedure vital signs reviewed and stable Respiratory status: spontaneous breathing, nonlabored ventilation, respiratory function stable and patient connected to nasal cannula oxygen Cardiovascular status: blood pressure returned to baseline and stable Postop Assessment: no apparent nausea or vomiting Anesthetic complications: no   No complications documented.   Last Vitals:  Vitals:   12/01/19 1042 12/01/19 1052  BP: (!) 155/119   Pulse: 79   Resp: 16   Temp:    SpO2: 99% 100%    Last Pain:  Vitals:   12/01/19 1052  TempSrc:   PainSc: 0-No pain                 Lenard Simmer

## 2019-12-04 ENCOUNTER — Encounter: Payer: Self-pay | Admitting: General Surgery

## 2019-12-04 LAB — SURGICAL PATHOLOGY

## 2020-02-29 ENCOUNTER — Other Ambulatory Visit: Payer: Self-pay | Admitting: Orthopedic Surgery

## 2020-02-29 DIAGNOSIS — M25461 Effusion, right knee: Secondary | ICD-10-CM

## 2020-02-29 DIAGNOSIS — M2391 Unspecified internal derangement of right knee: Secondary | ICD-10-CM

## 2020-02-29 DIAGNOSIS — G8929 Other chronic pain: Secondary | ICD-10-CM

## 2020-03-17 ENCOUNTER — Ambulatory Visit
Admission: RE | Admit: 2020-03-17 | Discharge: 2020-03-17 | Disposition: A | Discharge status: Home / Self Care | Payer: Medicaid Other | Source: Ambulatory Visit | Attending: Orthopedic Surgery | Admitting: Orthopedic Surgery

## 2020-03-17 DIAGNOSIS — G8929 Other chronic pain: Secondary | ICD-10-CM

## 2020-03-17 DIAGNOSIS — M2391 Unspecified internal derangement of right knee: Secondary | ICD-10-CM | POA: Insufficient documentation

## 2020-03-17 DIAGNOSIS — M25461 Effusion, right knee: Secondary | ICD-10-CM | POA: Diagnosis present

## 2020-03-17 DIAGNOSIS — M25561 Pain in right knee: Secondary | ICD-10-CM | POA: Insufficient documentation

## 2020-08-01 IMAGING — RF DG ESOPHAGUS
3 series · 7 of 7 positions shown · non-contrast
Comparison: No prior

CLINICAL DATA: Pharyngeal dysphagia.

EXAM:
ESOPHOGRAM/BARIUM SWALLOW
TECHNIQUE: Double contrast barium swallow was performed. Exam was limited as
patient could not hold down the barium.
FLUOROSCOPY TIME:  Fluoroscopy Time:  0 minutes 30 seconds
Radiation Exposure Index (if provided by the fluoroscopic device):
10.9 mGy

[Series 1: fluoro_barium 2fps_bw · 0.17mm/px · 4 of 7 frames shown (1 of 3)]
[frame 1/7]
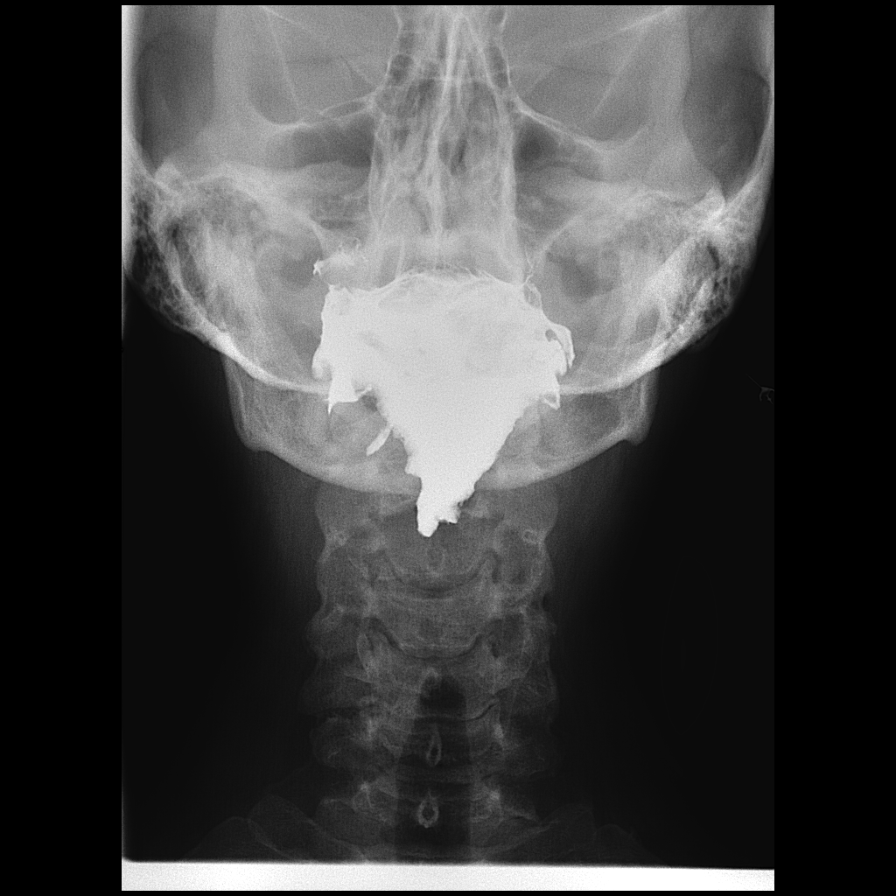
[frame 2/7]
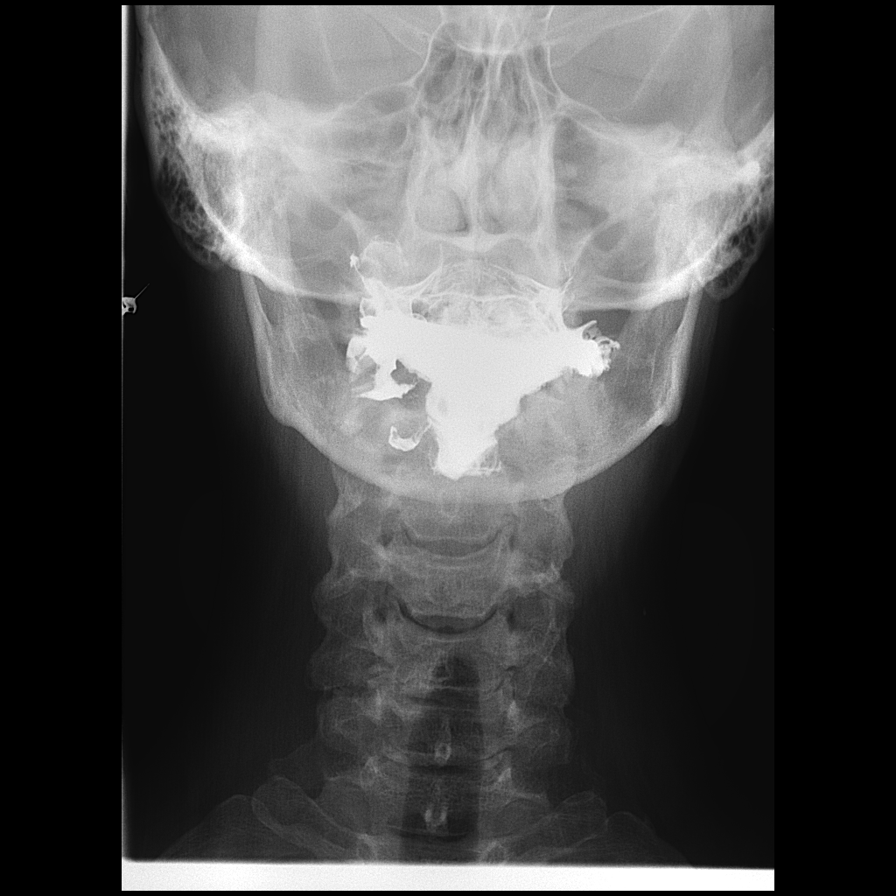
[frame 4/7]
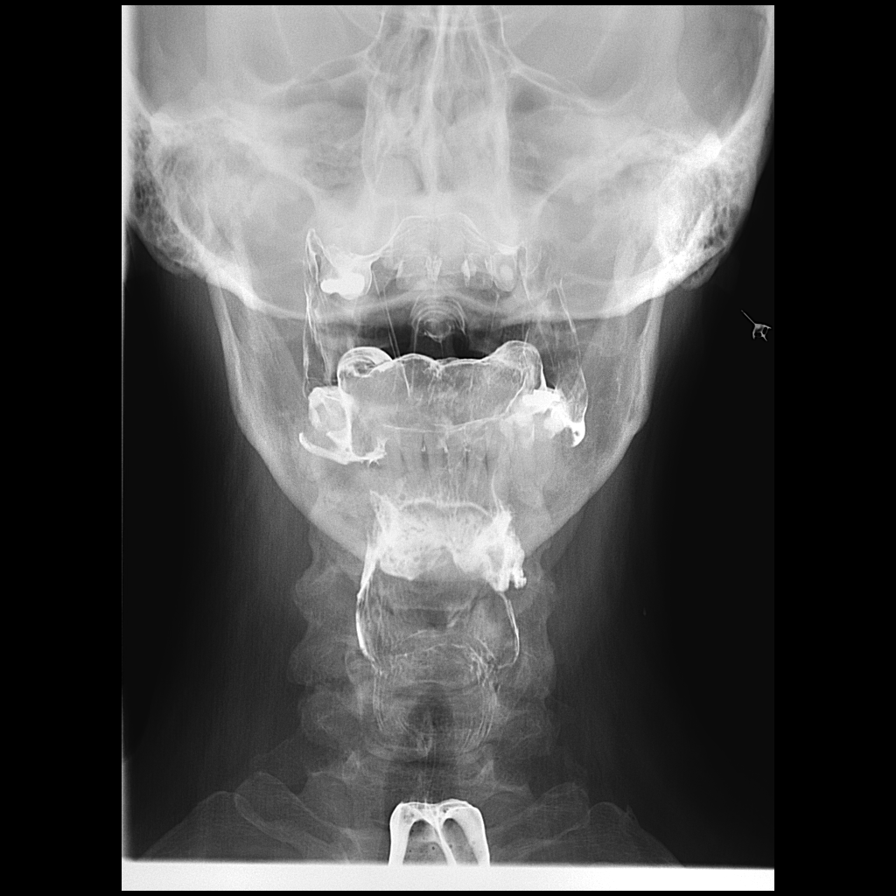
[frame 6/7]
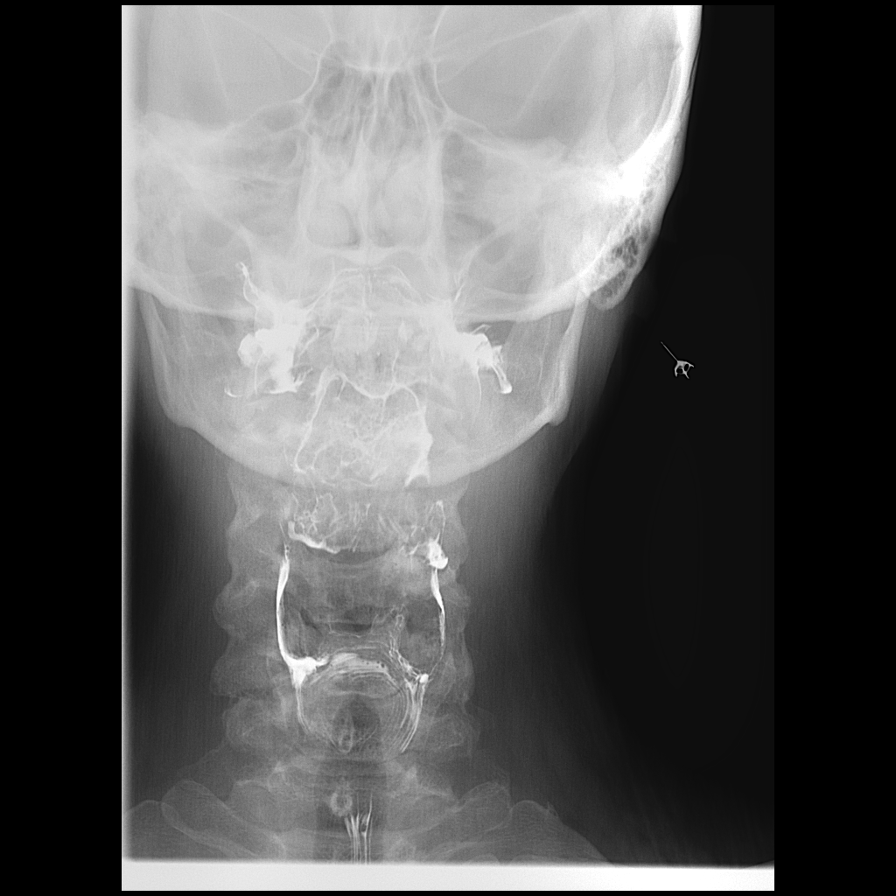

[Series 2: fluoro_barium 2fps_bw · 0.18mm/px · 2 of 2 frames shown (2 of 3)]
[frame 1/2]
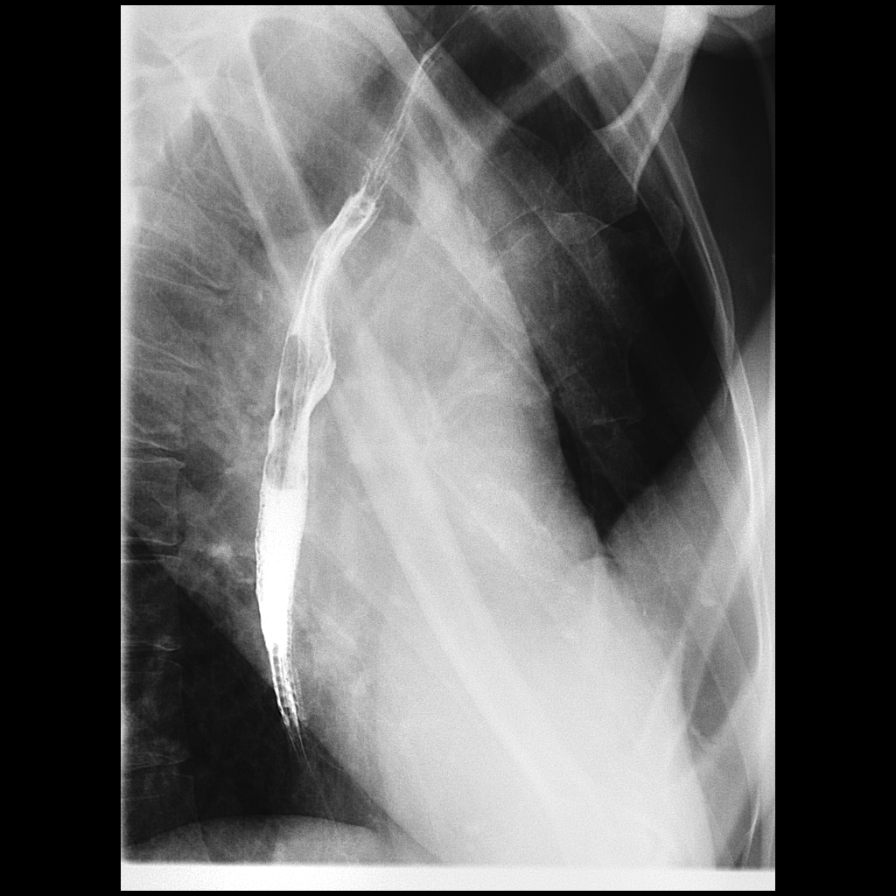
[frame 2/2]
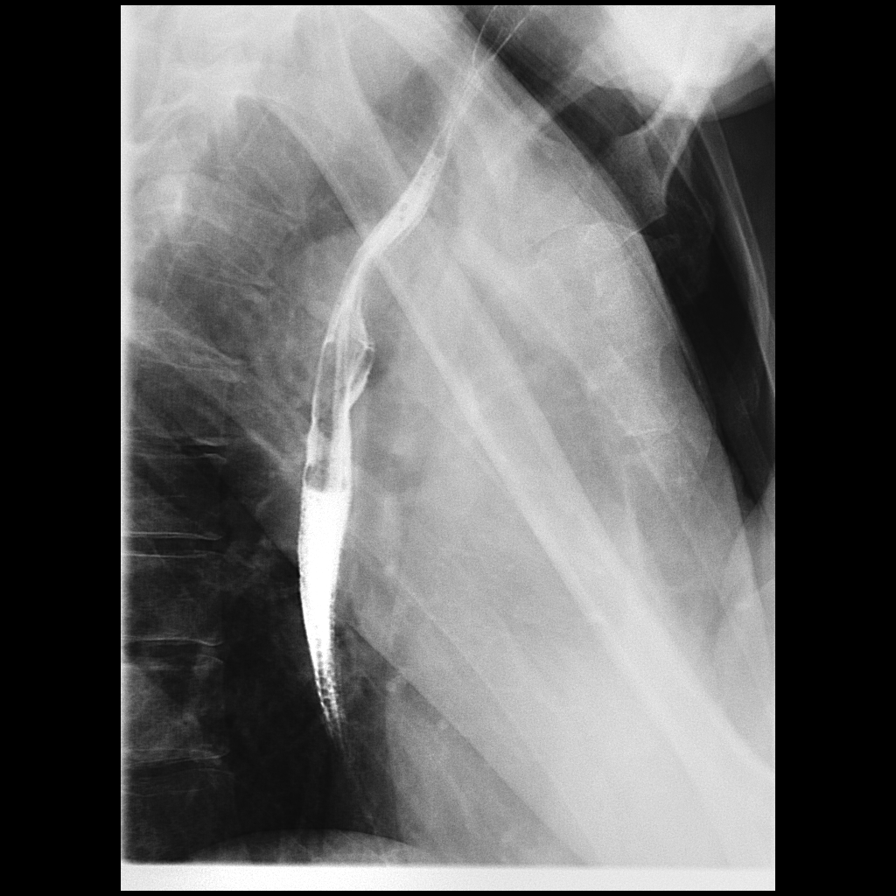

[Series 3: fluoro_barium 2fps_bw · 0.18mm/px · 1 of 1 slices shown (3 of 3)]
[im 1/1]
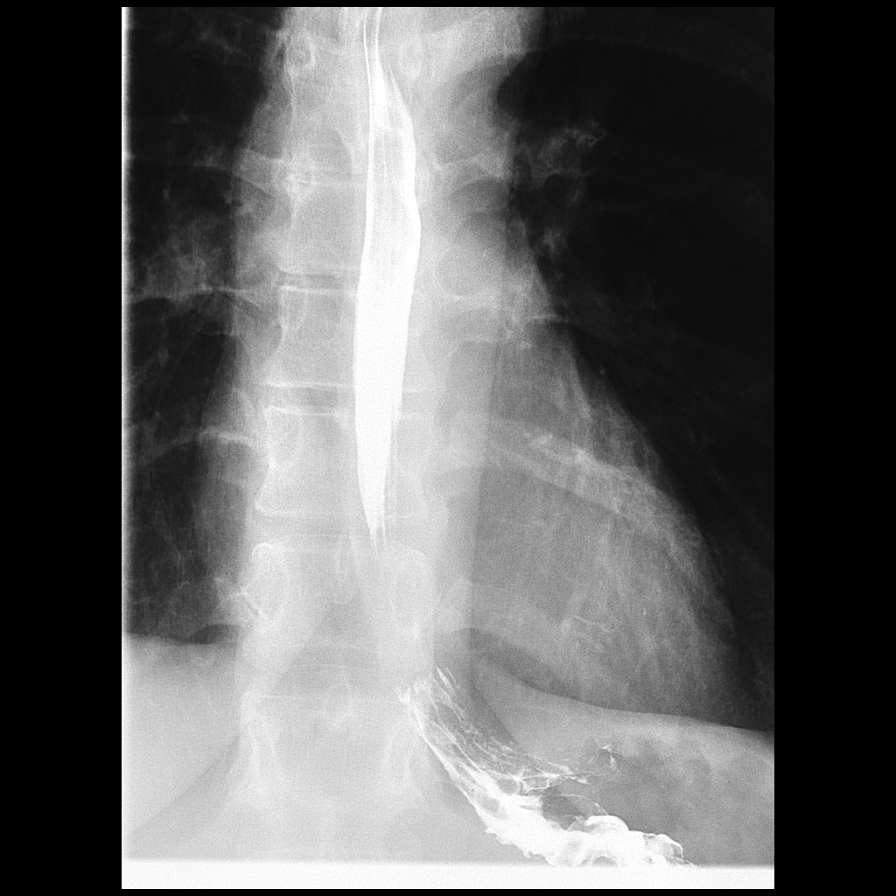

[7 of 7 positions shown; findings below may reference images not displayed]

FINDINGS: This exam was very limited as the patient could not hold down the
barium. Cervical and thoracic esophagus appear to be widely patent.
No obstructing abnormality identified.
IMPRESSION: Very limited exam as the patient could not hold at the barium. No
obstructing abnormality identified.
